# Patient Record
Sex: Female | Born: 1955
Health system: Southern US, Community
[De-identification: ages and names within clinical notes are randomized; demographics above are authoritative.]

---

## 2003-08-23 ENCOUNTER — Emergency Department (HOSPITAL_COMMUNITY): Admission: EM | Admit: 2003-08-23 | Discharge: 2003-08-24 | Payer: Self-pay | Admitting: Emergency Medicine

## 2003-08-24 ENCOUNTER — Ambulatory Visit (HOSPITAL_COMMUNITY): Admission: AD | Admit: 2003-08-24 | Discharge: 2003-08-24 | Payer: Self-pay | Admitting: Urology

## 2005-05-03 ENCOUNTER — Inpatient Hospital Stay (HOSPITAL_COMMUNITY): Admission: EM | Admit: 2005-05-03 | Discharge: 2005-05-04 | Payer: Self-pay | Admitting: Emergency Medicine

## 2006-02-13 IMAGING — CT CT HEAD W/O CM
1 series · 16 of 28 positions shown, 20 images · IV contrast (agent unspecified)
Comparison: None.

CLINICAL DATA: Sudden blurred vision twice today.  Rule out stroke. 
 HEAD CT WITHOUT CONTRAST:
TECHNIQUE: 5 mm collimated images were obtained from the base of the skull through the vertex according to standard protocol without contrast.

[Series 2: brain · axial · 0.47mm/px · z∈[+141,+268]mm · 16 of 28 slices shown, 20 images]
[im 2/28  brain]
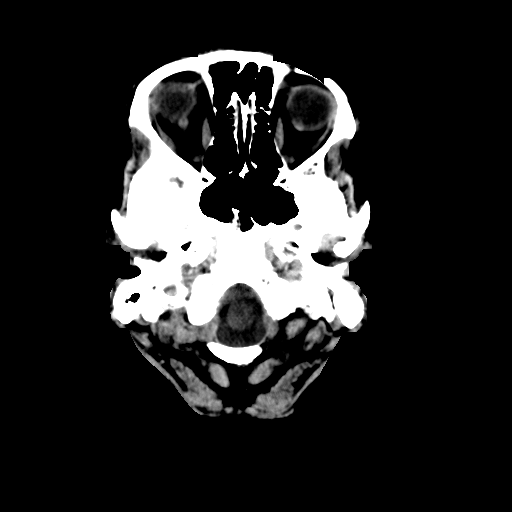
[im 2/28  bone]
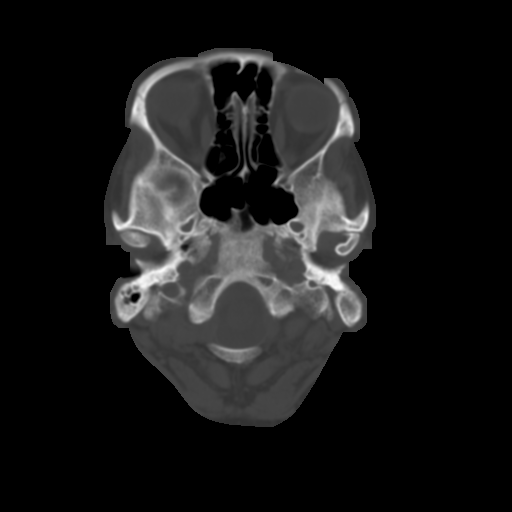
[im 4/28  brain]
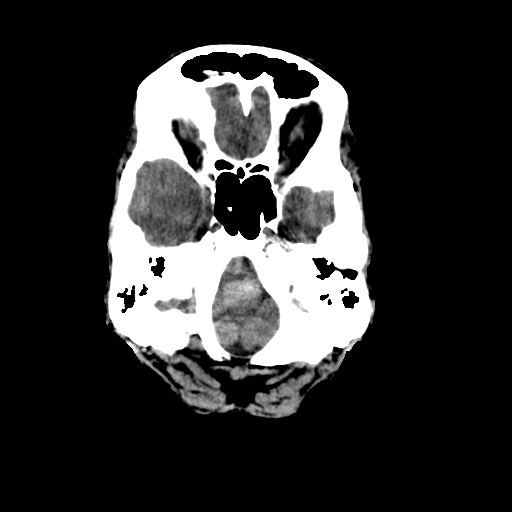
[im 6/28  brain]
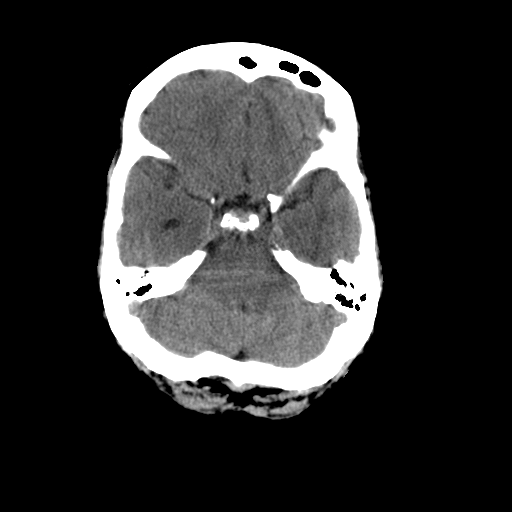
[im 7/28  brain]
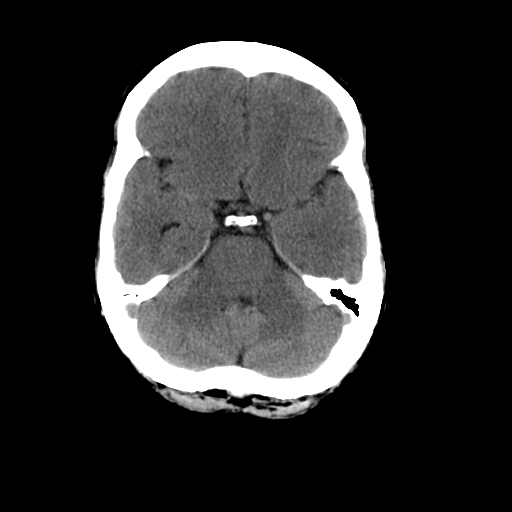
[im 9/28  brain]
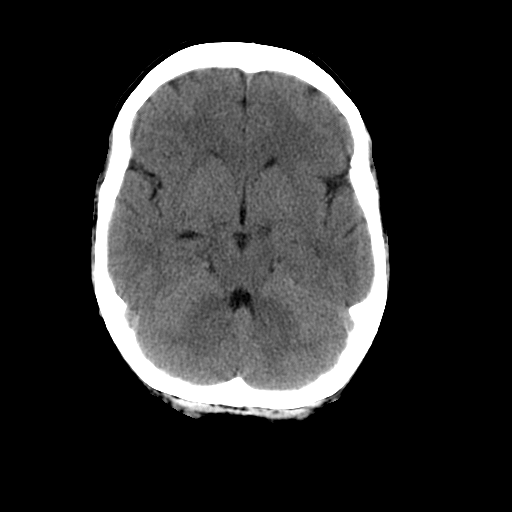
[im 9/28  bone]
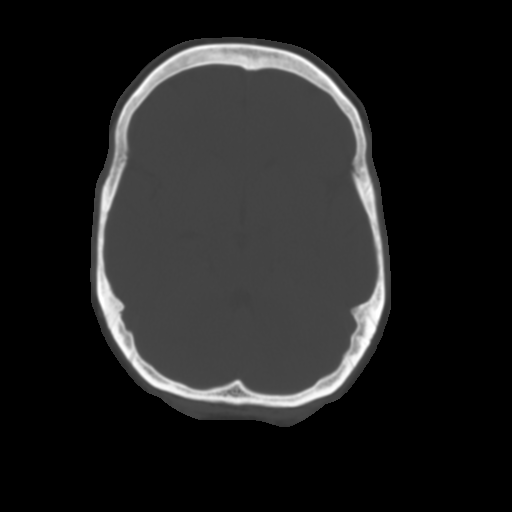
[im 10/28  brain]
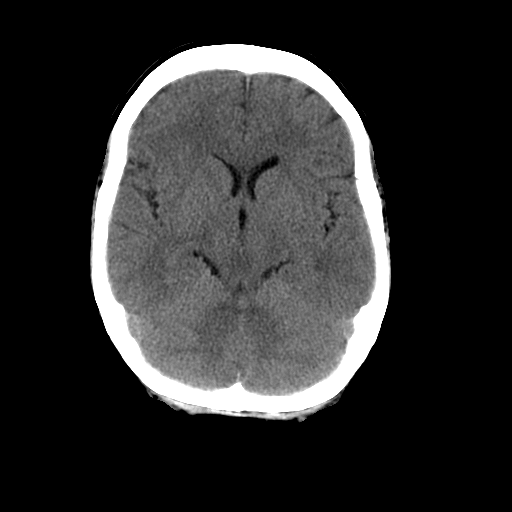
[im 12/28  brain]
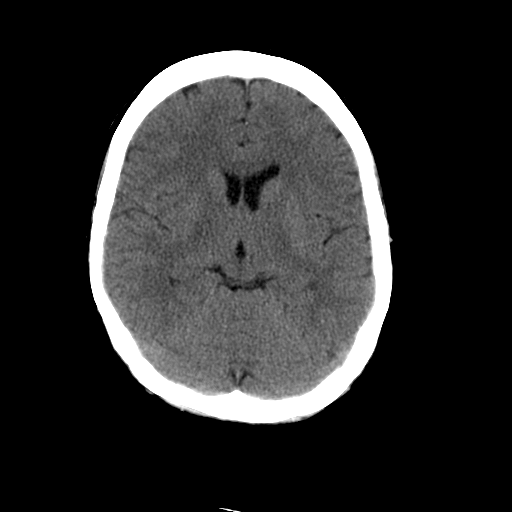
[im 14/28  brain]
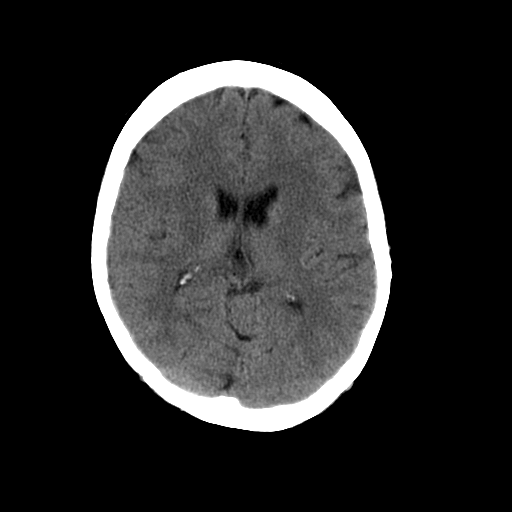
[im 15/28  brain]
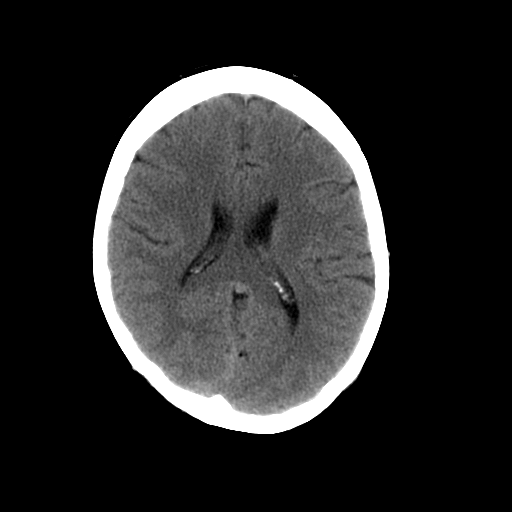
[im 15/28  bone]
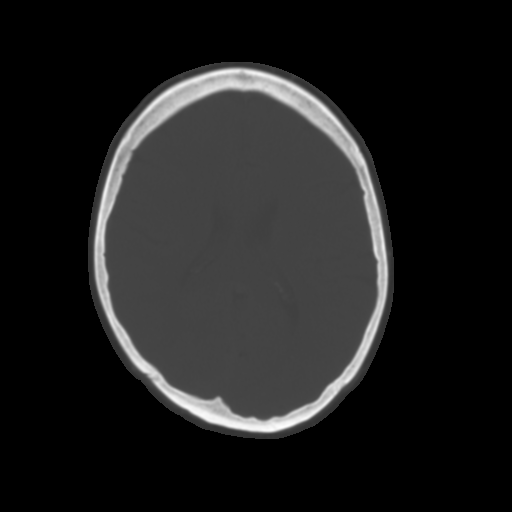
[im 17/28  brain]
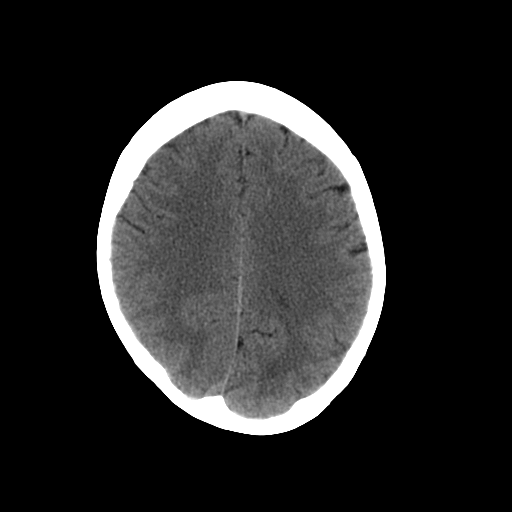
[im 19/28  brain]
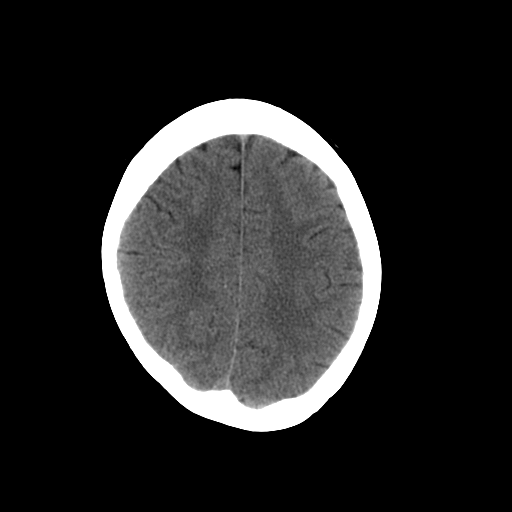
[im 20/28  brain]
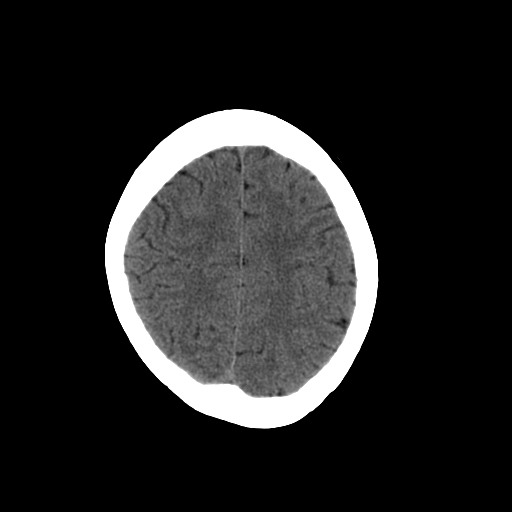
[im 22/28  brain]
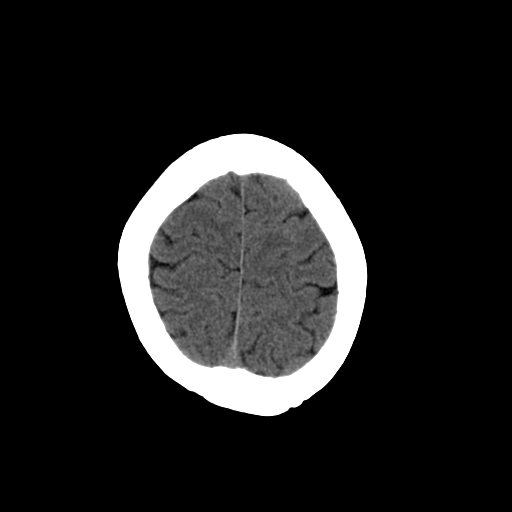
[im 22/28  bone]
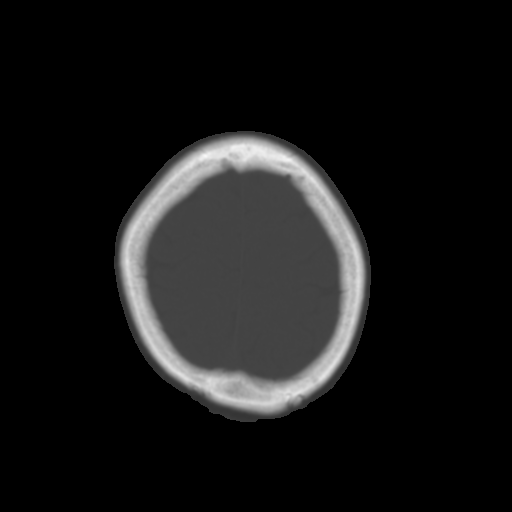
[im 23/28  brain]
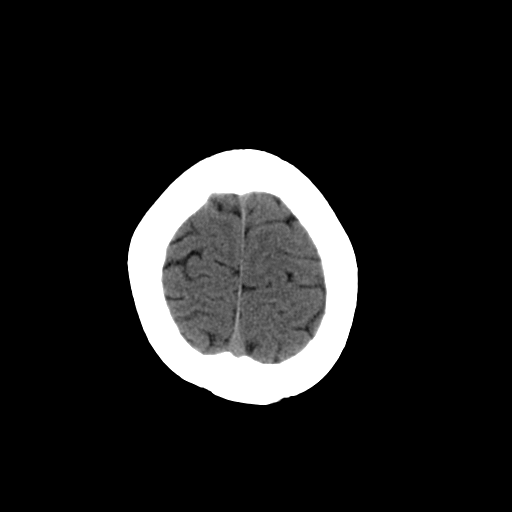
[im 25/28  brain]
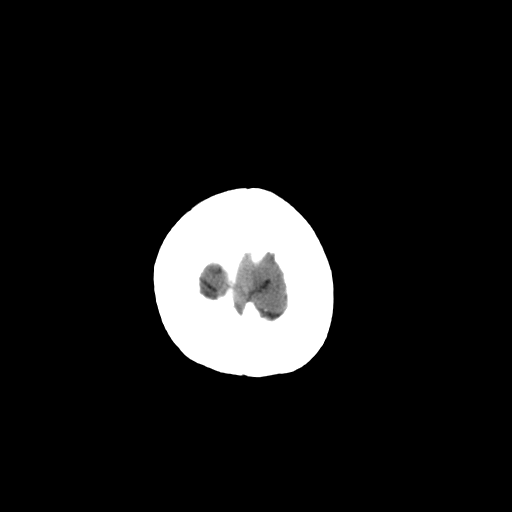
[im 27/28  brain]
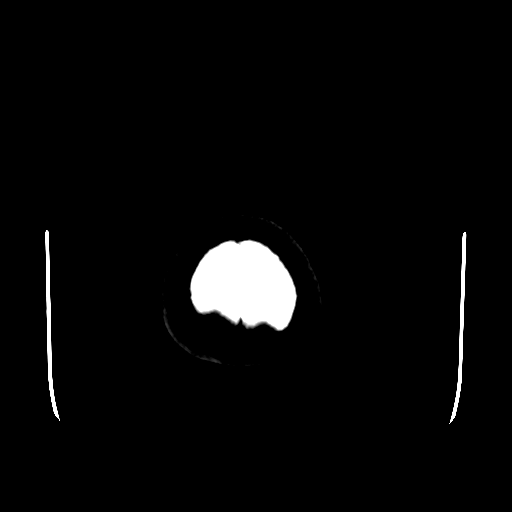

[16 of 28 positions shown; findings below may reference images not displayed]

FINDINGS: The brain parenchyma is normal in attenuation and morphology. 
 The ventricular volumes are normal.  The midline is maintained.  No abnormal extraaxial fluid collections or intracranial hemorrhage. 
 Mastoid air cells and paranasal sinuses are all normal pneumatized.
IMPRESSION: Normal brain.

## 2006-12-02 ENCOUNTER — Other Ambulatory Visit: Admission: RE | Admit: 2006-12-02 | Discharge: 2006-12-02 | Payer: Self-pay | Admitting: Family Medicine

## 2009-08-15 ENCOUNTER — Encounter: Admission: RE | Admit: 2009-08-15 | Discharge: 2009-09-15 | Payer: Self-pay | Admitting: Family Medicine

## 2009-11-16 ENCOUNTER — Other Ambulatory Visit: Admission: RE | Admit: 2009-11-16 | Discharge: 2009-11-16 | Payer: Self-pay | Admitting: Family Medicine

## 2011-03-15 NOTE — Op Note (Signed)
NAME:  Kathryn Hensley, Kathryn Hensley                           ACCOUNT NO.:  [id number]   MEDICAL RECORD NO.:  [id number]                   PATIENT TYPE:  AMB   LOCATION:  DAY                                  [facility]AC[facility]  [facility]   PHYSICIAN:  Ronald L. Earlene Plater, M.D.               DATE OF BIRTH:  06-13-1956   DATE OF PROCEDURE:  08/24/2003  DATE OF DISCHARGE:                                 OPERATIVE REPORT   PREOPERATIVE DIAGNOSIS:  Right ureteral calculus.   POSTOPERATIVE DIAGNOSIS:  Right ureteral calculus.   PROCEDURES:  1. Cystoscopy.  2. Right ureteroscopy with laser lithotripsy.  3. Right ureteral stent placement.  4. Right retrograde pyelogram.   SURGEON:  Ronald L. Earlene Plater, M.D.   ASSISTANT:  Susanne Borders, M.D.   ANESTHESIA:  General endotracheal.   DRAINS:  A 6 French x 26 cm double-J ureteral stent.   COMPLICATIONS:  None.   SPECIMENS:  Stone for analysis.   DISPOSITION:  Postanesthesia care unit in stable condition.   INDICATION FOR PROCEDURE:  Ms. Grange is a 55 year old white female with a  history of several days of right flank pain.  She was found to have a right  mid ureteral calculus approximately 6 mm in diameter.  She has been given  several days of pain and antinausea medication, but has been unable to pass  the stone.  Because of her persistent pain and inability to pass the stone,  she wished to have definitive treatment for the stone.  She agreed to  cystoscopy with right ureteroscopic lithotripsy after understanding the  risks, benefits, and alternatives.   DESCRIPTION OF PROCEDURE:  The patient was brought to the operating room and  correctly identified by her identification bracelet.  She as given  preoperative antibiotics and general endotracheal anesthesia.  She was  placed in the dorsal lithotomy position and prepped and draped in typical  sterile fashion.  A 22 French cystoscope with 12 degree lenses were used to  enter the patient's urethra and bladder.  The  urethra was within normal  limits.  The bladder demonstrated some injected blood vessels, but no other  mucosal abnormalities.  Bilateral ureteral orifices were noted in their  normal anatomic location.  A 6 French end-hole ureteral catheter was used to  cannulate the right ureteral orifice.  Approximately 6 mL of cystoscopic  Conray was injected and there was a distal filling defect approximately 3-4  cm above the right ureteral orifice consistent with the known calculus.  A  0.38 mm guide wire was placed through the ureteral catheter and the catheter  was removed.  The patient's bladder was then drained and a 6 French rigid  ureteroscope was used to enter the patient's urethra and bladder.  The  ureteral orifice was a little tight and we decided to dilate the patient's  right ureteral orifice with a 4 cm balloon dilator.  After  dilation, the  ureteroscope could easily be passed through the ureteral orifice up to the  level of the stone.  A 365 Micron laser fiber was then used at a setting of  5 joules and 5 hertz to laser the stone up into several small pieces.  After  the stone fragmentation was complete, the remaining stone fragments were  pulled from the ureter with a Nitinol basket.  The final survey with the  ureteroscope demonstrated no further stone material.  A 26 cm x 6 French  double-J ureteral stent was passed over the guide wire with fluoroscopic  guidance.  The stent was tethered.  An excellent curl was seen in the right  renal pelvis.  The cystoscope was replaced and a good distal curl was seen  under direct vision of the bladder.  The patient's bladder was then drained.  The string was taped to the patient's right thigh using Benzoin and pink  tape.  The patient was then awaken without complications and taken to the  postanesthesia care unit in stable condition, having tolerated the procedure  extremely well.  Please note that Windy Fast L. Earlene Plater, M.D., was present and   participated in all aspects of this case as he was the primary surgeon.     Susanne Borders, MD                           Lucrezia Starch. Earlene Plater, M.D.    DR/MEDQ  D:  08/24/2003  T:  08/24/2003  Job:  [id number]

## 2011-03-15 NOTE — Discharge Summary (Signed)
NAMEKHAMANI, DANIELY                 ACCOUNT NO.:  [id number]   MEDICAL RECORD NO.:  [id number]          PATIENT TYPE:  INP   LOCATION:  3017                         FACILITY:  [facility]   PHYSICIAN:  Casimiro Needle L. Reynolds, M.D.DATE OF BIRTH:  Aug 10, 1956   DATE OF ADMISSION:  05/03/2005  DATE OF DISCHARGE:  05/04/2005                                 DISCHARGE SUMMARY   ADMISSION DIAGNOSIS:  Amaurosis fugax, left eye.   DISCHARGE DIAGNOSIS:  Transient visual loss, left eye, presumed amaurosis  fugax, without evidence of significant carotid occlusive disease.   CONDITION ON DISCHARGE:  Stable.   DIET:  Low fat, low cholesterol.   ACTIVITY:  Ad lib.   MEDICATIONS ON DISCHARGE:  1.  Aspirin 81 mg p.o. daily (new).  2.  Multivitamin one daily (new).   STUDIES:  1.  MRA of the neck vessels with contrast performed May 04, 2005,      demonstrating significant bilateral external carotid artery disease but      no significant disease of the common or internal carotid arteries on      either side.  2.  Laboratory review.  CBC:  White count 8.3, hemoglobin 13.2, platelets      243,000.  Chemistries unremarkable.  Glucose 96.  Liver functions      unremarkable.  Coags normal.  Lipid panel performed fasting May 04, 2005:  Total cholesterol 195, HDL 43, LDL 135.   HOSPITAL COURSE:  Please see Admission H&P for full admission details.  Briefly, this is a 55 year old with little past medical history who  presented with near total painless visual loss o.s., concern of amaurosis  fugax was raised by a neuro-ophthalmologist and she presented to the [facility] Emergency Room for further evaluation of that.  She was subsequently  admitted primarily for evaluation of the carotid arteries.  This was  achieved on May 04, 2005, with MRA of the extracranial circulation, with the  results as noted above.  She had no events during the hospital stay  I also  looked into her vascular risk factors including  lipid panel, which is as  above, and homocysteine level which was pending at the time of discharge.  She was advised to take an aspirin a day, a multivitamin a day and was also  encouraged to stop smoking.  She was felt stable for discharge on the  afternoon of May 04, 2005, after reviewing her MRI.   FOLLOWUP:  She was advised to followup with her primary physician at  [facility] (Dr. Georgina Pillion) in 2 weeks, and will follow with  [facility] as needed.       MLR/MEDQ  D:  05/04/2005  T:  05/04/2005  Job:  [id number]   cc:   Lajean Manes, Dr.  [facility]  [address]  [place], [place] [address]

## 2011-03-15 NOTE — H&P (Signed)
Kathryn Hensley, Kathryn Hensley                 ACCOUNT NO.:  [id number]   MEDICAL RECORD NO.:  [id number]          PATIENT TYPE:  EMS   LOCATION:  MAJO                         FACILITY:  [facility]   PHYSICIAN:  Michael L. Reynolds, M.D.DATE OF BIRTH:  1956/01/25   DATE OF ADMISSION:  05/03/2005  DATE OF DISCHARGE:                                HISTORY & PHYSICAL   CHIEF COMPLAINT:  Transient left eye visual loss.   HISTORY OF PRESENT ILLNESS:  This is the initial [facility]  admission for this 55 year old woman with little past medical history.  The  patient reports that suddenly this morning she notes the onset of painless  near total visual loss on the left.  She was only able to see centrally with  peripheral bright light but no shapes.  This lasted about 15-20 minutes  and then completely resolved.  She denies any history of previous similar  events.  There was no associated headache, dizziness, nausea, vomiting,  slurred speech, or any numbness, tingling, or weakness of the extremities.  She does report having occasional brief splotches which last less than one  in minute which occur intermittently, and she actually had one of these this  evening while in the emergency room, but this other event was something  definitely different.  She does have a history of varying degree of  migraines, but she has not had any of these for years.  She saw an eye  doctor who suspected an ocular migraine but sent her to a neuro-  ophthalmologist who apparently raised concern about amaurosis fugax, and she  comes to the emergency room for further evaluation of that.   PAST MEDICAL HISTORY:  1.  She has a history of cervical cancer, with a hysterectomy in 1981.  2.  She denies any history of hypertension, hyperlipidemia, elevated blood      sugars, or heart disease.   FAMILY HISTORY:  Remarkable for a brother with a history of stroke and  coronary disease at a young age.  Her father also had  strokes at an older  age.   SOCIAL HISTORY:  She smokes a pack of cigarettes a day.  She is normally  independent in her activities of daily living.   ALLERGIES:  No known drug allergies.   MEDICATIONS:  None.   REVIEW OF SYSTEMS:  Negative across 10 systems, except for some recent nasal  congestion.  Otherwise per HPI and admission nursing records.   PHYSICAL EXAMINATION:  VITAL SIGNS:  Temperature 98.7; blood pressure  116/69; pulse 74; respirations 18.  GENERAL:  This is a healthy appearing female seen in no distress.  HEAD:  Cranium is normocephalic and atraumatic.  Oropharynx benign.  NECK:  Supple, without carotid bruits.  HEART:  Regular rate and rhythm, without murmurs.  CHEST:  Clear to auscultation bilaterally.  NEUROLOGIC:  Mental status:  She is awake and alert.  Speech is slow, but  not dysarthric.  Mood is euthymic, and affect appropriate.  She is fully  oriented to place and time.  Cranial nerves:  Funduscopic exam  is benign.  Specifically, blood vessels at the back of the left eye look fine.  Pupils  are equal and briskly reactive.  Extraocular movements full, without  nystagmus.  Visual fields full to confrontation.  Facial sensation is intact  to pin prick.  Face, tongue, and palate move normally and symmetrically.  Motor:  Normal bulk and tone.  Normal strength in all tested extremity  muscles.  Sensation intact to pin prick and light touch in all extremities.  Coordination:  Finger-to-nose and heel-to-shin are performed well.  Gait:  She arises from chair easily.  Her stance is normal.  She is able to heel,  toe, and tandem walk without difficulty.  Reflexes 2+ and symmetric.  Toes  are downgoing.   LABORATORY REVIEW:  CBC and CMET are unremarkable.  CT of the head is  personally reviewed and is unremarkable.   IMPRESSION:  Amaurosis fugax OS.   PLAN:  Will admit overnight to have an MRI of the neck done in the morning.  In the meantime, will treat with  aspirin daily and check stroke labs  including fasting lipid panel and homocysteine level.  If her MRI is okay,  she can probably be discharged in the morning.  She was encouraged to stop  smoking.       MLR/MEDQ  D:  05/03/2005  T:  05/04/2005  Job:  [id number]

## 2020-07-05 ENCOUNTER — Other Ambulatory Visit: Payer: Self-pay

## 2020-07-05 DIAGNOSIS — Z20822 Contact with and (suspected) exposure to covid-19: Secondary | ICD-10-CM

## 2020-07-07 LAB — NOVEL CORONAVIRUS, NAA: SARS-CoV-2, NAA: DETECTED — AB

## 2020-07-07 LAB — SARS-COV-2, NAA 2 DAY TAT

## 2020-07-08 ENCOUNTER — Telehealth (HOSPITAL_COMMUNITY): Payer: Self-pay | Admitting: Family

## 2020-07-08 DIAGNOSIS — U071 COVID-19: Secondary | ICD-10-CM

## 2020-07-08 NOTE — Telephone Encounter (Signed)
Called to discuss with Allen Norris about Covid symptoms and the use of casirivimab/imdevimab, a combination monoclonal antibody infusion for those with mild to moderate Covid symptoms and at a high risk of hospitalization.     Pt is qualified for this infusion at the infusion center due to co-morbid conditions and/or a member of an at-risk group, however declines infusion at this time. Symptoms tier reviewed as well as criteria for ending isolation.  Symptoms reviewed that would warrant ED/Hospital evaluation. Preventative practices reviewed. Patient verbalized understanding. Patient advised to call back if he decides that he does want to get infusion. Callback number to the infusion center given. Patient advised to go to Urgent care or ED with severe symptoms.   Last date she would be eligible for infusion is 07/13/20.   Symptoms of congestion, cough and fatigue with onset of 07/04/20.   BMI> 25.       Kiefer Opheim,NP

## 2020-07-13 ENCOUNTER — Telehealth: Payer: Self-pay | Admitting: Internal Medicine

## 2020-07-13 ENCOUNTER — Encounter: Payer: Self-pay | Admitting: Oncology

## 2020-07-13 ENCOUNTER — Telehealth: Payer: Self-pay | Admitting: Oncology

## 2020-07-13 NOTE — Telephone Encounter (Signed)
Called to Discuss with patient about Covid symptoms and the use of the monoclonal antibody infusion for those with mild to moderate Covid symptoms and at a high risk of hospitalization.     Pt is qualified for this infusion due to co-morbid conditions and/or a member of an at-risk group. BMI 31   There are no problems to display for this patient.   Patient declines infusion at this time. Symptoms tier reviewed as well as criteria for ending isolation. Preventative practices reviewed. Patient verbalized understanding.    Patient advised to call back if he/she decides that he/she does want to get infusion. Callback number to the infusion center given. Patient advised to go to Urgent care or ED with severe symptoms. Pt is on day 9 of symptoms. Told she would need to contact clinic ASAP if she changes her mind and cannot guarantee appt will be available.   Marcy Salvo, NP [facility]

## 2020-07-13 NOTE — Telephone Encounter (Signed)
Called to Discuss with patient about Covid symptoms and the use of regeneron, a monoclonal antibody infusion for those with mild to moderate Covid symptoms and at a high risk of hospitalization.     Pt is qualified for this infusion at the [facility] infusion center due to co-morbid conditions and/or a member of an at-risk group.    HIGH risk- BMI >25   Unable to reach pt. Left VM and mychart message.   Mignon Pine, AGNP-C [phone] (Infusion Center Hotline)

## 2020-11-07 ENCOUNTER — Encounter: Payer: Self-pay | Admitting: Orthopaedic Surgery

## 2020-11-07 ENCOUNTER — Ambulatory Visit: Payer: Self-pay

## 2020-11-07 ENCOUNTER — Other Ambulatory Visit: Payer: Self-pay

## 2020-11-07 ENCOUNTER — Ambulatory Visit: Payer: Self-pay | Admitting: Orthopaedic Surgery

## 2020-11-07 VITALS — Ht 69.0 in | Wt 200.0 lb

## 2020-11-07 DIAGNOSIS — M25562 Pain in left knee: Secondary | ICD-10-CM | POA: Diagnosis not present

## 2020-11-07 DIAGNOSIS — G8929 Other chronic pain: Secondary | ICD-10-CM

## 2020-11-07 DIAGNOSIS — M25552 Pain in left hip: Secondary | ICD-10-CM

## 2020-11-07 DIAGNOSIS — M79605 Pain in left leg: Secondary | ICD-10-CM

## 2020-11-07 MED ORDER — BUPIVACAINE HCL 0.25 % IJ SOLN
2.0000 mL | INTRAMUSCULAR | Status: AC | PRN
  Administered 2020-11-07: 2 mL via INTRA_ARTICULAR

## 2020-11-07 NOTE — Progress Notes (Signed)
Office Visit Note   Patient: Kathryn Hensley           Date of Birth: [birth year]           MRN: [id number] Visit Date: 11/07/2020              Requested by: No referring provider defined for this encounter. PCP: No primary care provider on file.   Assessment & Plan: Visit Diagnoses:  1. Pain in left hip   2. Chronic pain of left knee   3. Pain in left leg     Plan: Mrs. Suman has been experiencing pain in her left leg without a specific origin.  She is has experienced some pain in her left groin left thigh and left knee.  She has had a history of cortisone injection in her left knee in the past.  She does have some mild back pain but does not have any true radiculopathy.  She has not had any evidence of numbness or tingling or neurologic deficit.  X-rays of her pelvis were negative.  She does have some degenerative changes in her left knee.  I think there is a reasonable possibility that her problem is related to her knee.  I have injected the lateral aspect of her left knee with betamethasone and we will monitor her response over the next 3 to 4 weeks.  Consider further diagnostic testing and, specifically, lumbar spine if no improvement  Follow-Up Instructions: Return in about 1 month (around 12/08/2020).   Orders:  Orders Placed This Encounter  Procedures  . Large Joint Inj: L knee  . XR HIP UNILAT W OR W/O PELVIS 2-3 VIEWS LEFT  . XR KNEE 3 VIEW LEFT   No orders of the defined types were placed in this encounter.     Procedures: Large Joint Inj: L knee on 11/07/2020 4:29 PM Indications: pain and diagnostic evaluation Details: 25 G 1.5 in needle, lateral approach  Arthrogram: No  Medications: 2 mL bupivacaine 0.25 %  2 mL betamethasone injected with Marcaine in the lateral patellar region left knee Procedure, treatment alternatives, risks and benefits explained, specific risks discussed. Consent was given by the patient. Patient was prepped and draped in the usual sterile  fashion.       Clinical Data: No additional findings.   Subjective: Chief Complaint  Patient presents with  . Right Leg - Pain  Patient presents today for right hip, thigh, and knee pain. She said that it started about two months ago. No known injury. Her pain began in her lateral hip, groin, and then into her thigh. She said that now her right knee hurts and gets sharp shooting pains. No numbness or tingling in her lower extremities. She has been experiencing a burning sensation in her thigh. She states that her pain is worse with activity, but can also bother her while resting. She does state that she has lower back and buttock pain as well. No previous surgery in any of these areas. She has tried alternating Ibuprofen and Naproxen. She has also tried icing her knee.  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  Has not had any true radicular symptoms but mostly pain along the lateral left hip, left groin, thigh and knee.  Has had prior problems with her left knee with cortisone injection  HPI  Review of Systems   Objective: Vital Signs: Ht 5\' 9"  (1.753 m)   Wt 200 lb (90.7 kg)   BMI 29.53  kg/m   Physical Exam Constitutional:      Appearance: She is well-developed and well-nourished.  HENT:     Mouth/Throat:     Mouth: Oropharynx is clear and moist.  Eyes:     Extraocular Movements: EOM normal.     Pupils: Pupils are equal, round, and reactive to light.  Pulmonary:     Effort: Pulmonary effort is normal.  Skin:    General: Skin is warm and dry.  Neurological:     Mental Status: She is alert and oriented to person, place, and time.  Psychiatric:        Mood and Affect: Mood and affect normal.        Behavior: Behavior normal.     Ortho Exam painless range of motion of both hips with internal and external rotation.  No loss of motion left hip.  No pain along the lateral aspect of the hip i.e. trochanter or in the buttock.  Does have some pain along the  lateral patella consistent with a lateral patella tilt on her films.  No knee effusion.  Full extension and flexed over 100 degrees without instability.  Straight leg raise negative.  No percussible tenderness of the lumbar spine or either SI joint.  Motor and sensory exam intact  Specialty Comments:  No specialty comments available.  Imaging: XR HIP UNILAT W OR W/O PELVIS 2-3 VIEWS LEFT  Result Date: 11/07/2020 AP pelvis and lateral left hip were obtained without acute changes.  Joint spaces appear to be symmetrical bilaterally without narrowing.  Little bit of ectopic calcification of the lateral aspect of the acetabular lip little more on the left than the right.  Otherwise no ectopic calcification.  Mild degenerative changes of both SI joints.  XR KNEE 3 VIEW LEFT  Result Date: 11/07/2020 3 views of the left knee were obtained in the standing projection.  There is lateral patella tilt bilaterally.  No ectopic calcification.  Mild joint space narrowing more lateral than medial.  Small osteophytes laterally.  No acute changes.  Films are consistent with mild to moderate osteoarthritis    PMFS History: Patient Active Problem List   Diagnosis Date Noted  . Pain in left leg 11/07/2020   History reviewed. No pertinent past medical history.  History reviewed. No pertinent family history.  History reviewed. No pertinent surgical history. Social History   Occupational History  . Not on file  Tobacco Use  . Smoking status: Not on file  . Smokeless tobacco: Not on file  Substance and Sexual Activity  . Alcohol use: Not on file  . Drug use: Not on file  . Sexual activity: Not on file

## 2020-11-15 ENCOUNTER — Encounter: Payer: Self-pay | Admitting: Orthopaedic Surgery

## 2020-12-05 ENCOUNTER — Other Ambulatory Visit: Payer: Self-pay

## 2020-12-05 ENCOUNTER — Encounter: Payer: Self-pay | Admitting: Orthopaedic Surgery

## 2020-12-05 ENCOUNTER — Ambulatory Visit (INDEPENDENT_AMBULATORY_CARE_PROVIDER_SITE_OTHER): Payer: BC Managed Care – PPO

## 2020-12-05 ENCOUNTER — Ambulatory Visit: Payer: BC Managed Care – PPO | Admitting: Orthopaedic Surgery

## 2020-12-05 DIAGNOSIS — M545 Low back pain, unspecified: Secondary | ICD-10-CM | POA: Insufficient documentation

## 2020-12-05 DIAGNOSIS — M79605 Pain in left leg: Secondary | ICD-10-CM | POA: Diagnosis not present

## 2020-12-05 DIAGNOSIS — G8929 Other chronic pain: Secondary | ICD-10-CM

## 2020-12-05 DIAGNOSIS — M5442 Lumbago with sciatica, left side: Secondary | ICD-10-CM | POA: Diagnosis not present

## 2020-12-05 NOTE — Progress Notes (Signed)
Office Visit Note   Patient: Kathryn Hensley           Date of Birth: 1956-09-11           MRN: [id number] Visit Date: 12/05/2020              Requested by: No referring provider defined for this encounter. PCP: No primary care provider on file.   Assessment & Plan: Visit Diagnoses:  1. Pain in left leg   2. Chronic left-sided low back pain with left-sided sciatica     Plan: Mrs. Ringley relates that her left knee pain is "90% better".  She is also having some back left buttock and left thigh discomfort which seems to be completely separate.  She has had a history of some back problems when she was driving long distances in the past and I think she has a separate problem from her knee  related to her lumbar spine.  No numbness or tingling but "just pain".  The pain does not seem to radiate distal to the thigh.  No bowel or bladder changes.  Will order an MRI scan.  Follow-Up Instructions: Return in about 1 month (around 01/02/2021), or After MRI scan lumbar spine.   Orders:  Orders Placed This Encounter  Procedures  . MR Lumbar Spine w/o contrast   No orders of the defined types were placed in this encounter.     Procedures: No procedures performed   Clinical Data: No additional findings.   Subjective: Chief Complaint  Patient presents with  . Left Leg - Follow-up  Patient presents today for a one month follow up on her left leg. She received a cortisone injection in her left knee at her last visit. She said that the injection helped her knee feel about 80% better. She still has pain in her thigh, groin, and lower back. She takes Aleve or Ibuprofen as needed.  Has a history of recurrent back pain with discomfort into her left buttock and left groin.  Her hip exam was benign last week and again today so I think the problem might be related to her back  HPI  Review of Systems   Objective: Vital Signs: There were no vitals taken for this visit.  Physical  Exam Constitutional:      Appearance: She is well-developed and well-nourished.  HENT:     Mouth/Throat:     Mouth: Oropharynx is clear and moist.  Eyes:     Extraocular Movements: EOM normal.     Pupils: Pupils are equal, round, and reactive to light.  Pulmonary:     Effort: Pulmonary effort is normal.  Skin:    General: Skin is warm and dry.  Neurological:     Mental Status: She is alert and oriented to person, place, and time.  Psychiatric:        Mood and Affect: Mood and affect normal.        Behavior: Behavior normal.     Ortho Exam awake alert and oriented x3.  Comfortable sitting.  Left knee did not have the lateral joint pain that she had in the past. no effusion.  Painless range of motion and no instability.  Painless range of motion both right and left hip.  Straight leg raise negative.  Had some mild percussible tenderness at the lumbosacral junction.  No buttock discomfort.  Specialty Comments:  No specialty comments available.  Imaging: No results found.   PMFS History: Patient Active Problem List  Diagnosis Date Noted  . Low back pain 12/05/2020  . Pain in left leg 11/07/2020   History reviewed. No pertinent past medical history.  History reviewed. No pertinent family history.  History reviewed. No pertinent surgical history. Social History   Occupational History  . Not on file  Tobacco Use  . Smoking status: Not on file  . Smokeless tobacco: Not on file  Substance and Sexual Activity  . Alcohol use: Not on file  . Drug use: Not on file  . Sexual activity: Not on file

## 2020-12-22 ENCOUNTER — Ambulatory Visit
Admission: RE | Admit: 2020-12-22 | Discharge: 2020-12-22 | Disposition: A | Discharge status: Home / Self Care | Payer: BC Managed Care – PPO | Source: Ambulatory Visit | Attending: Orthopaedic Surgery | Admitting: Orthopaedic Surgery

## 2020-12-22 DIAGNOSIS — G8929 Other chronic pain: Secondary | ICD-10-CM

## 2020-12-22 DIAGNOSIS — M79605 Pain in left leg: Secondary | ICD-10-CM

## 2020-12-22 DIAGNOSIS — M5442 Lumbago with sciatica, left side: Secondary | ICD-10-CM

## 2020-12-27 ENCOUNTER — Ambulatory Visit: Payer: BC Managed Care – PPO | Admitting: Orthopaedic Surgery

## 2020-12-27 ENCOUNTER — Other Ambulatory Visit: Payer: Self-pay

## 2020-12-27 ENCOUNTER — Encounter: Payer: Self-pay | Admitting: Orthopaedic Surgery

## 2020-12-27 VITALS — Ht 69.0 in | Wt 200.0 lb

## 2020-12-27 DIAGNOSIS — G8929 Other chronic pain: Secondary | ICD-10-CM | POA: Diagnosis not present

## 2020-12-27 DIAGNOSIS — M5442 Lumbago with sciatica, left side: Secondary | ICD-10-CM

## 2020-12-27 NOTE — Progress Notes (Signed)
Office Visit Note   Patient: Kathryn Hensley           Date of Birth: May 19, 1956           MRN: [id number] Visit Date: 12/27/2020              Requested by: No referring provider defined for this encounter. PCP: Pcp, No   Assessment & Plan: Visit Diagnoses:  1. Chronic left-sided low back pain with left-sided sciatica     Plan: Kathryn Hensley had an MRI scan of her lumbar spine demonstrating mild multifactorial spinal stenosis at L4-5 with asymmetric narrowing of the left lateral recess and possible left L5 nerve root encroachment.  There was also mild asymmetric narrowing of the left foramen at L5-S1 with possible left L5 nerve root encroachment.  She is experiencing back and left lower extremity pain particularly when she stands for a length of time or walks any distance.  We have had a long discussion regarding the above I think therapy would be helpful as well as an epidural steroid injection.  We will set the therapy up for her here in the office as it is convenient and asked Dr. Alvester Morin to consider an epidural steroid injection.  She notes that her knee problem previously addressed is much better  Follow-Up Instructions: Return in about 1 month (around 01/27/2021).   Orders:  Orders Placed This Encounter  Procedures  . Ambulatory referral to Physical Medicine Rehab  . Ambulatory referral to Physical Therapy   No orders of the defined types were placed in this encounter.     Procedures: No procedures performed   Clinical Data: No additional findings.   Subjective: Chief Complaint  Patient presents with  . Lower Back - Follow-up    MRI review  Patient presents today for follow up on her lower back. She had an MRI done and is here today to discuss those results.  No change in symptoms.  Denies any bowel or bladder changes.  On occasion she feels like her left leg may be a little bit weak.  The pain still originates in the lumbar spine radiating to her left buttock her left  thigh and sometimes to her left calf and foot.  No symptoms on the right  HPI  Review of Systems   Objective: Vital Signs: Ht 5\' 9"  (1.753 m)   Wt 200 lb (90.7 kg)   BMI 29.53 kg/m   Physical Exam Constitutional:      Appearance: She is well-developed and well-nourished.  HENT:     Mouth/Throat:     Mouth: Oropharynx is clear and moist.  Eyes:     Extraocular Movements: EOM normal.     Pupils: Pupils are equal, round, and reactive to light.  Pulmonary:     Effort: Pulmonary effort is normal.  Skin:    General: Skin is warm and dry.  Neurological:     Mental Status: She is alert and oriented to person, place, and time.  Psychiatric:        Mood and Affect: Mood and affect normal.        Behavior: Behavior normal.     Ortho Exam awake alert and oriented x3.  Comfortable sitting and in no acute distress.  Straight leg raise negative bilaterally.  Motor and sensory exam intact.  Reflexes were symmetrical.  No percussible tenderness of the lumbar spine or the paraspinal area.  No sacral discomfort or discomfort over the SI joints.  Painless range of  motion both hips  Specialty Comments:  No specialty comments available.  Imaging: No results found.   PMFS History: Patient Active Problem List   Diagnosis Date Noted  . Low back pain 12/05/2020  . Pain in left leg 11/07/2020   History reviewed. No pertinent past medical history.  History reviewed. No pertinent family history.  History reviewed. No pertinent surgical history. Social History   Occupational History  . Not on file  Tobacco Use  . Smoking status: Not on file  . Smokeless tobacco: Not on file  Substance and Sexual Activity  . Alcohol use: Not on file  . Drug use: Not on file  . Sexual activity: Not on file

## 2021-01-09 ENCOUNTER — Other Ambulatory Visit: Payer: Self-pay

## 2021-01-09 ENCOUNTER — Ambulatory Visit: Payer: BC Managed Care – PPO | Attending: Orthopaedic Surgery | Admitting: Physical Therapy

## 2021-01-09 ENCOUNTER — Encounter: Payer: Self-pay | Admitting: Physical Therapy

## 2021-01-09 DIAGNOSIS — G8929 Other chronic pain: Secondary | ICD-10-CM | POA: Insufficient documentation

## 2021-01-09 DIAGNOSIS — M5442 Lumbago with sciatica, left side: Secondary | ICD-10-CM | POA: Insufficient documentation

## 2021-01-09 DIAGNOSIS — M25552 Pain in left hip: Secondary | ICD-10-CM | POA: Diagnosis present

## 2021-01-09 DIAGNOSIS — R262 Difficulty in walking, not elsewhere classified: Secondary | ICD-10-CM | POA: Insufficient documentation

## 2021-01-09 NOTE — Therapy (Signed)
[facility] [address] [place], [address] Phone: [phone]   Fax:  [phone]  Physical Therapy Evaluation  Patient Details  Name: Kathryn Hensley MRN: [id number] Date of Birth: 08/18/1956 Referring Provider (PT): Valeria Batman, MD (orthopedics)   Encounter Date: 01/09/2021   PT End of Session - 01/09/21 1907    Visit Number 1    Number of Visits 24    Date for PT Re-Evaluation 04/03/21    Authorization Type [employer] reporting period from 01/09/2021    Authorization - Visit Number 1    Authorization - Number of Visits 30   PT/OT/Chiro   Progress Note Due on Visit 10    PT Start Time 1515    PT Stop Time 1600    PT Time Calculation (min) 45 min    Activity Tolerance Patient tolerated treatment well    Behavior During Therapy [facility] for tasks assessed/performed           History reviewed. No pertinent past medical history.  History reviewed. No pertinent surgical history.  There were no vitals filed for this visit.    Subjective Assessment - 01/09/21 1525    Subjective Patient reports when her pain started she thought it was her left knee. She even had a joint injection in the left knee (Jan/feb? This year) which helped the knee but not the rest of her pain. She was also having some pain in her left groin, hip, and left lower back. 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 She did not do much this weekend and she felt so much better yesterday but back and hip started hurting again today after going back to work yesterday.  She re(prts increasing pain/paresthesia by the end of the week of work. She thinks her pain started at the end of November. She had had COVID19 in September when she was due for second COVID shot. No specific injury or event. She cannot remember if it came on gradually or suddenly. Reports the symptom come and go. If she  stays off of it. It gets better, if she is doing more it causes problems. Weekly cycles of better/worse based on her work schedule. History of sciatica in left side, but has not had an issue with it lately and this feels different. No sciatica or pain on right side. Planning to get back injection next Monday. Has tried naproxin, ibuprofen, heat/ice with minimal improvement.    Pertinent History Patient is a 65 y.o. female who presents to outpatient physical therapy with a referral for medical diagnosis chronic left-sided low back pain with left-sided sciatica. This patient's chief complaints consist of left low back pain with radiation towards left LE, left hip and knee pain leading to the following functional deficits: difficulty with all of her usual activities, making them slower, such as dressing, working, stairs, weight bearing, standing, walking, etc, and decreases her quality of life.   Relevant past medical history and comorbidities include BPPV, hx of hysterectomy and left sided sciatica.  Patient denies hx of cancer, stroke, seizures, lung problem, major cardiac events, diabetes, unexplained weight loss, changes in bowel or bladder problems, new onset stumbling or dropping things, spine surgery.    Limitations Lifting;Standing;Walking;House hold activities   Functional Limitations: difficulty with all of her usual activities, making them slower, such as dressing, lifting, bending, working, stairs, weight bearing, standing,  walking, etc, and decreases her quality of life.   How long can you stand comfortably? 1 or 1/2 lap around mall    Diagnostic tests Lumbar MRI report 12/22/2020: "  IMPRESSION:  1. Mild multifactorial spinal stenosis at L4-5 with asymmetric  narrowing of the left lateral recess and possible left L5 nerve root  encroachment.  2. Mild asymmetric narrowing of the left foramen at L5-S1 with  possible left L5 nerve root encroachment."    Patient Stated Goals "get rid of this pain"     Currently in Pain? Yes    Pain Score 8    W: 21/10; B: 3-4/10   Pain Location Back    Pain Orientation Left;Lower    Pain Descriptors / Indicators Burning   "feels like someone is pushing in back" thigh "almost like a menstrual cram"   Pain Type Chronic pain    Pain Radiating Towards R groin and anterior thigh, lateral knee. Denies pain to feet. Does have tingling down to the foot when symptoms are worse.    Pain Onset More than a month ago    Pain Frequency Constant    Aggravating Factors  stairs, walking, bending, lifting, stretching forwards    Pain Relieving Factors sitting, recliner, not moving    Effect of Pain on Daily Activities Functional Limitations: difficulty with all of her usual activities, making them slower, such as dressing, lifting, bending, working, stairs, weight bearing, standing, walking, etc, and decreases her quality of life.              [facility] PT Assessment - 01/09/21 0001      Assessment   Medical Diagnosis chronic left-sided low back pain with left-sided sciatica    Referring Provider (PT) Valeria Batman, MD (orthopedics)    Onset Date/Surgical Date --   November 2021   Prior Therapy none for this problem prior to current episode of care      Precautions   Precautions None      Restrictions   Weight Bearing Restrictions No      Balance Screen   Has the patient fallen in the past 6 months No    Has the patient had a decrease in activity level because of a fear of falling?  No    Is the patient reluctant to leave their home because of a fear of falling?  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 Posture: mild forward flexion in standing, mildly flattened lumbar curve.  . Tremor: none . Muscle bulk: no gross abnormalities . Bed mobility: supine<> sit and rolling WFL except slower due to pain . Transfers: sit <> stand mod I due to caution and decreased speed.  . Gait: grossly WFL for household and short community ambulation. More detailed gait analysis deferred to later date as needed.   NEUROLOGICAL Upper Motor Neuron Screen Hoffman's, and Clonus (ankle) negative bilaterally Dermatomes . L2-S2  appears equal and intact to light touch. Myotomes . L2-S1 appears intact, left sided weakness for knee flexion that appeared neurological (S2).  Neurodynamic Tests    SLR: R = negative, L = positive for posterior thigh pain.   SPINE MOTION Lumbar AROM *Indicates pain - Flexion: = 2 inches proximal to ankles, tightness in back of legs, not concordant. No further pain with OP - Extension: = 50% no concordant pain, left low back concordant pain with OP - Rotation: R = WNL but concordant pain at L groin and low back, L = WNL. - Side Flexion (with OP): R = WNL, L = WNL   PERIPHERAL JOINT MOTION (in degrees) Passive Range of Motion (AROM) BLE appear grossly WFL except:  L hip IR limited and reproduces pain in L low back.   MUSCLE PERFORMANCE (MMT):  *Indicates pain 01/09/21 Date Date  Joint/Motion R/L R/L R/L  Hip     Flexion 4/4 / /  Extension (knee ext) 4/4* / /  Abduction 4+/4+ / /  Knee     Extension 5/5 / /  Flexion 5/4 / /  Comments: neurologic weakness L knee flexion. B ankles WFL, able to heel and toe walk.   SPECIAL TESTS: Straight leg raise (SLR): R = negative, L = positive for posterior thigh pain.  SIdelying Femoral Nerve Neurodynamic test: L = positive for anterior R thigh pain, slightly less with neck extension.  FABER: R =  positive for lateral hip pain (not concordant), L = positive for concordant groin pain  ACCESSORY MOTION:  - CPA to lowest lumbar segments reproduced back pain (reports no reproduction of concordant leg pain).   PALPATION: - TTP at L greater trochanter, sensitive and ticklish to palpation at R deep hip rotators.   EDUCATION/COGNITION: Patient is alert and oriented X 4.  Objective measurements completed on examination: See above findings.      PT Education - 01/09/21 1907    Education Details examination purpose/form. Self management techniques. Education on diagnosis, prognosis, POC, anatomy and physiology of current condition    Person(s) Educated Patient    Methods Explanation;Demonstration;Tactile cues;Verbal cues    Comprehension Verbalized understanding;Returned demonstration;Verbal cues required;Tactile cues required;Need further instruction            PT Short Term Goals - 01/09/21 1857      PT SHORT TERM GOAL #1   Title Be independent with initial home exercise program for self-management of symptoms.    Baseline Initial HEP to be provided at visit 2 as appropriate (01/09/2021);    Time 3    Period Weeks    Status New    Target Date 01/30/21             PT Long Term Goals - 01/09/21 1855      PT LONG TERM GOAL #1   Title Be independent with a long-term home exercise program for self-management of symptoms.    Baseline initial HEP to be provided at visit 2 as appropriate (01/09/2021);    Time 12    Period Weeks    Status New   TARGET DATE FOR ALL LONG TERM GOALS: 04/03/2021     PT LONG TERM GOAL #2   Title Demonstrate improved FOTO score to equal or greater than 66 by visit #10 to demonstrate improvement in overall condition and self-reported functional ability.    Baseline 52 (01/07/2021);    Time 12    Period Weeks    Status New  PT LONG TERM GOAL #3   Title Have full lumbar and left hip AROM with no compensations or increase in pain in all planes  except intermittent end range discomfort to allow patient to complete valued activities with less difficulty.    Baseline painful and limited - see objective (01/09/2021);    Time 12    Period Weeks    Status New      PT LONG TERM GOAL #4   Title Improve B LE  strength with no increase in pain to 5/5 for improved ability to allow patient to complete valued functional tasks such as work and stairs without difficulty.    Baseline painful and weak - see objective exam (01/09/2021);    Time 12    Period Weeks    Status New      PT LONG TERM GOAL #5   Title Complete community, work and/or recreational activities without limitation due to current condition.    Baseline difficulty with all of her usual activities, making them slower, such as dressing, lifting, bending, working, stairs, weight bearing, standing, walking (01/09/2021);    Time 12    Period Weeks    Status New      Additional Long Term Goals   Additional Long Term Goals Yes      PT LONG TERM GOAL #6   Title Reduce pain with functional activities to equal or less than 1/10 to allow patient to complete usual activities including ADLs, IADLs, and social engagement with less difficulty.    Baseline 12/10 (01/09/2021);    Time 12    Period Weeks    Status New                  Plan - 01/09/21 1920    Clinical Impression Statement Patient is a 65 y.o. female referred to outpatient physical therapy with a medical diagnosis of chronic left-sided low back pain with left-sided sciatica who presents with signs and symptoms consistent with chronic low back pain with radiation towards the left LE including paresthesia to the toes. Patient may also have a contributing hip problem based on positive FABER test, location of symptoms, and examination. However, unable to rule out low back as source of left hip pain and may be fully radiation form low back. Will continue to assess at future sessions. Patient presents with significant pain,  joint stiffness, ROM, muscle tension, paresthesia, muscle performance (strength/power/endurance), and activity tolerance impairments that are limiting ability to complete her usual activities, making them slower, such as dressing, lifting, bending, working, stairs, weight bearing, standing, walking, etc, without difficulty, and decreases her quality of life. Patient will benefit from skilled physical therapy intervention to address current body structure impairments and activity limitations to improve function and work towards goals set in current POC in order to return to prior level of function or maximal functional improvement.    Personal Factors and Comorbidities Age;Past/Current Experience;Comorbidity 1;Profession;Time since onset of injury/illness/exacerbation;Comorbidity 3+    Comorbidities BPPV, hx of L sided sciatica, hysterectomy    Examination-Activity Limitations Bend;Squat;Hygiene/Grooming;Bathing;Lift;Stairs;Locomotion Level;Stand;Caring for Others;Carry;Transfers;Dressing    Examination-Participation Restrictions Interpersonal Relationship;Occupation;Driving;Community Activity;Cleaning;Shop;Laundry;Yard Work   difficulty with all of her usual activities, making them slower, such as dressing, lifting, bending, working, stairs, weight bearing, standing, walking, etc, and decreases her quality of life.   Stability/Clinical Decision Making Evolving/Moderate complexity    Clinical Decision Making Moderate    Rehab Potential Good    PT Frequency 2x / week    PT Duration 12  weeks    PT Treatment/Interventions ADLs/Self Care Home Management;Aquatic Therapy;Cryotherapy;Moist Heat;Electrical Stimulation;Neuromuscular re-education;Therapeutic exercise;Therapeutic activities;Patient/family education;Manual techniques;Dry needling;Passive range of motion;Spinal Manipulations;Joint Manipulations    PT Next Visit Plan etablish initial HEP    PT Home Exercise Plan TBD    Consulted and Agree with Plan of  Care Patient           Patient will benefit from skilled therapeutic intervention in order to improve the following deficits and impairments:  Abnormal gait,Increased fascial restricitons,Dizziness,Pain,Decreased mobility,Increased muscle spasms,Decreased activity tolerance,Decreased endurance,Decreased range of motion,Decreased strength,Hypomobility,Impaired perceived functional ability,Impaired flexibility,Difficulty walking  Visit Diagnosis: Chronic left-sided low back pain with left-sided sciatica  Pain in left hip  Difficulty in walking, not elsewhere classified     Problem List Patient Active Problem List   Diagnosis Date Noted  . Low back pain 12/05/2020  . Pain in left leg 11/07/2020    Luretha MurphySara R. Ilsa IhaSnyder, PT, DPT 01/09/21, 7:28 PM  [facility] [address] [place], [place], [id number] Phone: [phone]   Fax:  [phone]  Name: Allen NorrisDebra C Evinger MRN: [id number] Date of Birth: Jul 11, 1956

## 2021-01-11 ENCOUNTER — Encounter: Payer: Self-pay | Admitting: Physical Therapy

## 2021-01-11 ENCOUNTER — Ambulatory Visit: Payer: BC Managed Care – PPO | Admitting: Physical Therapy

## 2021-01-11 ENCOUNTER — Other Ambulatory Visit: Payer: Self-pay

## 2021-01-11 DIAGNOSIS — M5442 Lumbago with sciatica, left side: Secondary | ICD-10-CM

## 2021-01-11 DIAGNOSIS — R262 Difficulty in walking, not elsewhere classified: Secondary | ICD-10-CM

## 2021-01-11 DIAGNOSIS — G8929 Other chronic pain: Secondary | ICD-10-CM

## 2021-01-11 DIAGNOSIS — M25552 Pain in left hip: Secondary | ICD-10-CM

## 2021-01-11 NOTE — Therapy (Signed)
[facility] PH[facility] MEDICINE [address], [place], [address] Phone: [phone]   Fax:  [phone]  Physical Therapy Treatment  Patient Details  Name: Kathryn Hensley MRN: [id number] Date of Birth: 02-05-1956 Referring Provider (PT): Valeria Batman, MD (orthopedics)   Encounter Date: 01/11/2021   PT End of Session - 01/11/21 1930    Visit Number 2    Number of Visits 24    Date for PT Re-Evaluation 04/03/21    Authorization Type [employer] reporting period from 01/09/2021    Authorization - Visit Number 2    Authorization - Number of Visits 30   PT/OT/Chiro   Progress Note Due on Visit 10    PT Start Time 1647    PT Stop Time 1727    PT Time Calculation (min) 40 min    Activity Tolerance Patient limited by pain    Behavior During Therapy [facility] for tasks assessed/performed           History reviewed. No pertinent past medical history.  History reviewed. No pertinent surgical history.  There were no vitals filed for this visit.   Subjective Assessment - 01/11/21 1648    Subjective Patient reports she was in a lot of pain when she got up yesterday morning in L back, hip, leg, knee. Naproxyn helped a little bit and is definitely better than yesterday. It did ease up yesterday while she was working after taking medication. Reports current pain 5/10 at L groin, posterior hip, back, and left knee (which hasn't hurt since she got the cortisone shot). Knee pain is at the proximal pole of the patella and also feels swollen.    Pertinent History Patient is a 65 y.o. female who presents to outpatient physical therapy with a referral for medical diagnosis chronic left-sided low back pain with left-sided sciatica. This patient's chief complaints consist of left low back pain with radiation towards left LE, left hip and knee pain leading to the following functional deficits: difficulty with all of her usual activities, making them slower,  such as dressing, working, stairs, weight bearing, standing, walking, etc, and decreases her quality of life.   Relevant past medical history and comorbidities include BPPV, hx of hysterectomy and left sided sciatica.  Patient denies hx of cancer, stroke, seizures, lung problem, major cardiac events, diabetes, unexplained weight loss, changes in bowel or bladder problems, new onset stumbling or dropping things, spine surgery.    Limitations Lifting;Standing;Walking;House hold activities   Functional Limitations: difficulty with all of her usual activities, making them slower, such as dressing, lifting, bending, working, stairs, weight bearing, standing, walking, etc, and decreases her quality of life.   How long can you stand comfortably? 1 or 1/2 lap around mall    Diagnostic tests Lumbar MRI report 12/22/2020: "  IMPRESSION:  1. Mild multifactorial spinal stenosis at L4-5 with asymmetric  narrowing of the left lateral recess and possible left L5 nerve root  encroachment.  2. Mild asymmetric narrowing of the left foramen at L5-S1 with  possible left L5 nerve root encroachment."    Patient Stated Goals "get rid of this pain"    Currently in Pain? Yes    Pain Score 5     Pain Onset More than a month ago           TREATMENT:   Therapeutic exercise: to centralize symptoms and improve ROM, strength, muscular endurance, and activity tolerance required for successful completion of functional activities.   -  standing lumbar extension with support behind 1x10.produced groin and low back pain no worse.    Prone with one pillow under hips:  - prone abdominal brace, 5 second hold, 1x10 - prone abdominal brace with alternating hip extension (multiffidus kick), 1x10 each side plus ~ 5 reps each side to learn technique.   Hooklying:  - posterior pelvic tilt (PPT), 1x10 (stopped due to left sided pain).   Seated:  - lumbar flexion with theraball, x 15 (feels okay) - PPT with abdominal brace, 1x10, 5  second hold - PPT with abdominal brace and alternating marching, 1x10 each side. (seems well tolerated).   - Education on HEP including handout   Manual therapy: to reduce pain and tissue tension, improve range of motion, neuromodulation, in order to promote improved ability to complete functional activities. - supine long axis distraction through left LE, caused increased low back pain and sharpness in the groin.  - hooklying R hip mobilization grade II-IV, lateral and caudal, discontinued due to pain at low back and glute with both of those.  - prone CPA to thoracic and lumbar spine, grade II-IV, to improve motion, decrease pain. (uncomfortable overall with no decrease in pain with repetition) - prone STM to lumbar and thoracic paraspinals L > R, L QL, left glutes and deep hip rotators. (uncomfortable overall).     HOME EXERCISE PROGRAM  Access Code: [email or web address] URL: [email or web address] Date: 01/11/2021 Prepared by: Norton Blizzard  Exercises Seated Posterior Pelvic Tilt - 1 x daily - 1 sets - 10 reps - 5 seconds hold Seated March - 1 x daily - 2 sets - 10 reps - 1-5 second hold hold     PT Education - 01/11/21 1651    Education Details examination purpose/form. Self management techniques    Person(s) Educated Patient    Methods Explanation;Demonstration;Tactile cues;Verbal cues    Comprehension Verbalized understanding;Returned demonstration;Verbal cues required;Tactile cues required;Need further instruction            PT Short Term Goals - 01/09/21 1857      PT SHORT TERM GOAL #1   Title Be independent with initial home exercise program for self-management of symptoms.    Baseline Initial HEP to be provided at visit 2 as appropriate (01/09/2021);    Time 3    Period Weeks    Status New    Target Date 01/30/21             PT Long Term Goals - 01/09/21 1855      PT LONG TERM GOAL #1   Title Be independent with a long-term home exercise program for  self-management of symptoms.    Baseline initial HEP to be provided at visit 2 as appropriate (01/09/2021);    Time 12    Period Weeks    Status New   TARGET DATE FOR ALL LONG TERM GOALS: 04/03/2021     PT LONG TERM GOAL #2   Title Demonstrate improved FOTO score to equal or greater than 66 by visit #10 to demonstrate improvement in overall condition and self-reported functional ability.    Baseline 52 (01/07/2021);    Time 12    Period Weeks    Status New      PT LONG TERM GOAL #3   Title Have full lumbar and left hip AROM with no compensations or increase in pain in all planes except intermittent end range discomfort to allow patient to complete valued activities with less difficulty.    Baseline painful and limited -  see objective (01/09/2021);    Time 12    Period Weeks    Status New      PT LONG TERM GOAL #4   Title Improve B LE  strength with no increase in pain to 5/5 for improved ability to allow patient to complete valued functional tasks such as work and stairs without difficulty.    Baseline painful and weak - see objective exam (01/09/2021);    Time 12    Period Weeks    Status New      PT LONG TERM GOAL #5   Title Complete community, work and/or recreational activities without limitation due to current condition.    Baseline difficulty with all of her usual activities, making them slower, such as dressing, lifting, bending, working, stairs, weight bearing, standing, walking (01/09/2021);    Time 12    Period Weeks    Status New      Additional Long Term Goals   Additional Long Term Goals Yes      PT LONG TERM GOAL #6   Title Reduce pain with functional activities to equal or less than 1/10 to allow patient to complete usual activities including ADLs, IADLs, and social engagement with less difficulty.    Baseline 12/10 (01/09/2021);    Time 12    Period Weeks    Status New                 Plan - 01/11/21 1939    Clinical Impression Statement Patient  unresponsive to manual techniques utilized to decrease pain and tension. Had some difficulty with supine exercise due to pain possibly due to pressure on sore regions of pelvis from bed. Was mildly limited by need to keep head up related to pt concerns about provoking vertigo with head down position as has happened in the past. Seemed to tolerate sitting flexion and stabilization exercises well, so added to HEP. Patient noted to be favoring L LE with antalgic gait on her way out of clinic. Overall, struggled with tolerance for PT this session and seems to have some trouble communicating/articulating discomfort at times. Recommend caution with extension and focus on flexion based stabilization and mobility exercise with low neural tension positioning in L LE. Patient would benefit from continued management of limiting condition by skilled physical therapist to address remaining impairments and functional limitations to work towards stated goals and return to PLOF or maximal functional independence.    Personal Factors and Comorbidities Age;Past/Current Experience;Comorbidity 1;Profession;Time since onset of injury/illness/exacerbation;Comorbidity 3+    Comorbidities BPPV, hx of L sided sciatica, hysterectomy    Examination-Activity Limitations Bend;Squat;Hygiene/Grooming;Bathing;Lift;Stairs;Locomotion Level;Stand;Caring for Others;Carry;Transfers;Dressing    Examination-Participation Restrictions Interpersonal Relationship;Occupation;Driving;Community Activity;Cleaning;Shop;Laundry;Yard Work   difficulty with all of her usual activities, making them slower, such as dressing, lifting, bending, working, stairs, weight bearing, standing, walking, etc, and decreases her quality of life.   Stability/Clinical Decision Making Evolving/Moderate complexity    Rehab Potential Good    PT Frequency 2x / week    PT Duration 12 weeks    PT Treatment/Interventions ADLs/Self Care Home Management;Aquatic  Therapy;Cryotherapy;Moist Heat;Electrical Stimulation;Neuromuscular re-education;Therapeutic exercise;Therapeutic activities;Patient/family education;Manual techniques;Dry needling;Passive range of motion;Spinal Manipulations;Joint Manipulations    PT Next Visit Plan Flexion based stabilization exercises, nerve glides when appropriate    PT Home Exercise Plan Medbridge Access Code: [email or web address]    Consulted and Agree with Plan of Care Patient           Patient will benefit from skilled therapeutic intervention in order to improve  the following deficits and impairments:  Abnormal gait,Increased fascial restricitons,Dizziness,Pain,Decreased mobility,Increased muscle spasms,Decreased activity tolerance,Decreased endurance,Decreased range of motion,Decreased strength,Hypomobility,Impaired perceived functional ability,Impaired flexibility,Difficulty walking  Visit Diagnosis: Chronic left-sided low back pain with left-sided sciatica  Pain in left hip  Difficulty in walking, not elsewhere classified     Problem List Patient Active Problem List   Diagnosis Date Noted  . Low back pain 12/05/2020  . Pain in left leg 11/07/2020    Luretha MurphySara R. Ilsa IhaSnyder, PT, DPT 01/11/21, 7:41 PM  [facility] [address] [place], [place], [address] Phone: [phone]   Fax:  [phone]  Name: Allen NorrisDebra C Odonoghue MRN: [id number] Date of Birth: 1956/08/13

## 2021-01-15 ENCOUNTER — Ambulatory Visit: Payer: BC Managed Care – PPO | Admitting: Physical Medicine and Rehabilitation

## 2021-01-15 ENCOUNTER — Encounter: Payer: Self-pay | Admitting: Physical Medicine and Rehabilitation

## 2021-01-15 ENCOUNTER — Ambulatory Visit: Payer: Self-pay

## 2021-01-15 ENCOUNTER — Other Ambulatory Visit: Payer: Self-pay

## 2021-01-15 VITALS — BP 128/74 | HR 68

## 2021-01-15 DIAGNOSIS — M5416 Radiculopathy, lumbar region: Secondary | ICD-10-CM

## 2021-01-15 MED ORDER — BETAMETHASONE SOD PHOS & ACET 6 (3-3) MG/ML IJ SUSP
12.0000 mg | Freq: Once | INTRAMUSCULAR | Status: AC
  Administered 2021-01-15: 12 mg

## 2021-01-15 NOTE — Progress Notes (Signed)
Kathryn Hensley - 65 y.o. female MRN [id number]  Date of birth: 1956/05/24  Office Visit Note: Visit Date: 01/15/2021 PCP: Pcp, No Referred by: Valeria Batman, MD  Subjective: Chief Complaint  Patient presents with  . Lower Back - Pain  . Left Thigh - Pain  . Left Knee - Pain   HPI:  Kathryn Hensley is a 65 y.o. female who comes in today at the request of Dr. Norlene Campbell for planned Left L5-S1 Lumbar epidural steroid injection with fluoroscopic guidance.  The patient has failed conservative care including home exercise, medications, time and activity modification.  This injection will be diagnostic and hopefully therapeutic.  Please see requesting physician notes for further details and justification. MRI reviewed with images and spine model.  MRI reviewed in the note below.  She does get pain into the left groin particular going from sit to stand.  With prolonged standing she does get referral in the groin as well.  Depending on relief consider intra-articular hip injection with question of labral issue.    ROS Otherwise per HPI.  Assessment & Plan: Visit Diagnoses:    ICD-10-CM   1. Lumbar radiculopathy  M54.16 XR C-ARM NO REPORT    Epidural Steroid injection    betamethasone acetate-betamethasone sodium phosphate (CELESTONE) injection 12 mg    Plan: No additional findings.   Meds & Orders:  Meds ordered this encounter  Medications  . betamethasone acetate-betamethasone sodium phosphate (CELESTONE) injection 12 mg    Orders Placed This Encounter  Procedures  . XR C-ARM NO REPORT  . Epidural Steroid injection    Follow-up: Return for visit to requesting physician as needed.   Procedures: No procedures performed  Lumbosacral Transforaminal Epidural Steroid Injection - Sub-Pedicular Approach with Fluoroscopic Guidance  Patient: Kathryn Hensley      Date of Birth: [birth year] MRN: [id number] PCP: Pcp, No      Visit Date: 01/15/2021   Universal Protocol:     Date/Time: 01/15/2021  Consent Given By: the patient  Position: PRONE  Additional Comments: Vital signs were monitored before and after the procedure. Patient was prepped and draped in the usual sterile fashion. The correct patient, procedure, and site was verified.   Injection Procedure Details:   Procedure diagnoses: Lumbar radiculopathy [M54.16]    Meds Administered:  Meds ordered this encounter  Medications  . betamethasone acetate-betamethasone sodium phosphate (CELESTONE) injection 12 mg    Laterality: Left  Location/Site:  L5-S1  Needle:5.0 in., 22 ga.  Short bevel or Quincke spinal needle  Needle Placement: Transforaminal  Findings:    -Comments: Excellent flow of contrast along the nerve, nerve root and into the epidural space.  Procedure Details: After squaring off the end-plates to get a true AP view, the C-arm was positioned so that an oblique view of the foramen as noted above was visualized. The target area is just inferior to the "nose of the scotty dog" or sub pedicular. The soft tissues overlying this structure were infiltrated with 2-3 ml. of 1% Lidocaine without Epinephrine.  The spinal needle was inserted toward the target using a "trajectory" view along the fluoroscope beam.  Under AP and lateral visualization, the needle was advanced so it did not puncture dura and was located close the 6 O'Clock position of the pedical in AP tracterory. Biplanar projections were used to confirm position. Aspiration was confirmed to be negative for CSF and/or blood. A 1-2 ml. volume of Isovue-250 was injected and flow of contrast was  noted at each level. Radiographs were obtained for documentation purposes.   After attaining the desired flow of contrast documented above, a 0.5 to 1.0 ml test dose of 0.25% Marcaine was injected into each respective transforaminal space.  The patient was observed for 90 seconds post injection.  After no sensory deficits were reported, and  normal lower extremity motor function was noted,   the above injectate was administered so that equal amounts of the injectate were placed at each foramen (level) into the transforaminal epidural space.   Additional Comments:  The patient tolerated the procedure well Dressing: 2 x 2 sterile gauze and Band-Aid    Post-procedure details: Patient was observed during the procedure. Post-procedure instructions were reviewed.  Patient left the clinic in stable condition.      Clinical History: MRI LUMBAR SPINE WITHOUT CONTRAST  TECHNIQUE: Multiplanar, multisequence MR imaging of the lumbar spine was performed. No intravenous contrast was administered.  COMPARISON:  Lumbar spine radiographs 12/05/2020.  FINDINGS: Segmentation: Conventional anatomy assumed, with the last open disc space designated L5-S1.Concordant with prior imaging.  Alignment:  Physiologic.  Vertebrae: No worrisome osseous lesion, acute fracture or pars defect. Mild endplate degenerative changes at the L4-5 and L5-S1 levels. The visualized sacroiliac joints appear unremarkable.  Conus medullaris: Extends to the L1-2 level and appears normal.  Paraspinal and other soft tissues: No significant paraspinal findings.  Disc levels:  No significant disc space findings from T11-12 through L2-3.  L3-4: Disc height and hydration are maintained. Mild disc bulging and facet hypertrophy. No spinal stenosis or nerve root encroachment.  L4-5: Loss of disc height with annular disc bulging and a small central disc protrusion. Mild facet and ligamentous hypertrophy. These factors contribute to mild spinal stenosis with asymmetric narrowing of the left lateral recess and possible left L5 nerve root encroachment. The foramina appear sufficiently patent, and no L4 nerve root encroachment identified.  L5-S1: Loss of disc height with disc bulging and endplate osteophytes asymmetric to the left. Mild facet and  ligamentous hypertrophy. Mild asymmetric narrowing of the left foramen which could contribute to foraminal left L5 nerve root encroachment. The spinal canal, lateral recesses and right foramen are patent.  IMPRESSION: 1. Mild multifactorial spinal stenosis at L4-5 with asymmetric narrowing of the left lateral recess and possible left L5 nerve root encroachment. 2. Mild asymmetric narrowing of the left foramen at L5-S1 with possible left L5 nerve root encroachment.   Electronically Signed   By: Carey Bullocks M.D.   On: 12/23/2020 10:37     Objective:  VS:  HT:    WT:   BMI:     BP:128/74  HR:68bpm  TEMP: ( )  RESP:  Physical Exam Vitals and nursing note reviewed.  Constitutional:      General: She is not in acute distress.    Appearance: Normal appearance. She is not ill-appearing.  HENT:     Head: Normocephalic and atraumatic.     Right Ear: External ear normal.     Left Ear: External ear normal.  Eyes:     Extraocular Movements: Extraocular movements intact.  Cardiovascular:     Rate and Rhythm: Normal rate.     Pulses: Normal pulses.  Pulmonary:     Effort: Pulmonary effort is normal. No respiratory distress.  Abdominal:     General: There is no distension.     Palpations: Abdomen is soft.  Musculoskeletal:        General: Tenderness present.     Cervical back:  Neck supple.     Right lower leg: No edema.     Left lower leg: No edema.     Comments: Patient has good distal strength with no pain over the greater trochanters.  No clonus or focal weakness.  Skin:    Findings: No erythema, lesion or rash.  Neurological:     General: No focal deficit present.     Mental Status: She is alert and oriented to person, place, and time.     Sensory: No sensory deficit.     Motor: No weakness or abnormal muscle tone.     Coordination: Coordination normal.  Psychiatric:        Mood and Affect: Mood normal.        Behavior: Behavior normal.      Imaging: No  results found.

## 2021-01-15 NOTE — Progress Notes (Signed)
Pt state lower back pain that travels to her left groin area down to her left knee. Pt state walking, standing and sitting make the pain worse. Pt state she take pain meds and uses heating pads to help ease the pain.  Numeric Pain Rating Scale and Functional Assessment Average Pain 7   In the last MONTH (on 0-10 scale) has pain interfered with the following?  1. General activity like being  able to carry out your everyday physical activities such as walking, climbing stairs, carrying groceries, or moving a chair?  Rating(9)   +Driver, -BT, -Dye Allergies.

## 2021-01-15 NOTE — Patient Instructions (Signed)

## 2021-01-15 NOTE — Procedures (Signed)
Lumbosacral Transforaminal Epidural Steroid Injection - Sub-Pedicular Approach with Fluoroscopic Guidance  Patient: Kathryn Hensley      Date of Birth: 08-14-1956 MRN: [id number] PCP: Pcp, No      Visit Date: 01/15/2021   Universal Protocol:    Date/Time: 01/15/2021  Consent Given By: the patient  Position: PRONE  Additional Comments: Vital signs were monitored before and after the procedure. Patient was prepped and draped in the usual sterile fashion. The correct patient, procedure, and site was verified.   Injection Procedure Details:   Procedure diagnoses: Lumbar radiculopathy [M54.16]    Meds Administered:  Meds ordered this encounter  Medications  . betamethasone acetate-betamethasone sodium phosphate (CELESTONE) injection 12 mg    Laterality: Left  Location/Site:  L5-S1  Needle:5.0 in., 22 ga.  Short bevel or Quincke spinal needle  Needle Placement: Transforaminal  Findings:    -Comments: Excellent flow of contrast along the nerve, nerve root and into the epidural space.  Procedure Details: After squaring off the end-plates to get a true AP view, the C-arm was positioned so that an oblique view of the foramen as noted above was visualized. The target area is just inferior to the "nose of the scotty dog" or sub pedicular. The soft tissues overlying this structure were infiltrated with 2-3 ml. of 1% Lidocaine without Epinephrine.  The spinal needle was inserted toward the target using a "trajectory" view along the fluoroscope beam.  Under AP and lateral visualization, the needle was advanced so it did not puncture dura and was located close the 6 O'Clock position of the pedical in AP tracterory. Biplanar projections were used to confirm position. Aspiration was confirmed to be negative for CSF and/or blood. A 1-2 ml. volume of Isovue-250 was injected and flow of contrast was noted at each level. Radiographs were obtained for documentation purposes.   After attaining  the desired flow of contrast documented above, a 0.5 to 1.0 ml test dose of 0.25% Marcaine was injected into each respective transforaminal space.  The patient was observed for 90 seconds post injection.  After no sensory deficits were reported, and normal lower extremity motor function was noted,   the above injectate was administered so that equal amounts of the injectate were placed at each foramen (level) into the transforaminal epidural space.   Additional Comments:  The patient tolerated the procedure well Dressing: 2 x 2 sterile gauze and Band-Aid    Post-procedure details: Patient was observed during the procedure. Post-procedure instructions were reviewed.  Patient left the clinic in stable condition.

## 2021-01-16 ENCOUNTER — Encounter: Payer: Self-pay | Admitting: Physical Therapy

## 2021-01-16 ENCOUNTER — Ambulatory Visit: Payer: BC Managed Care – PPO | Admitting: Physical Therapy

## 2021-01-16 DIAGNOSIS — R262 Difficulty in walking, not elsewhere classified: Secondary | ICD-10-CM

## 2021-01-16 DIAGNOSIS — M5442 Lumbago with sciatica, left side: Secondary | ICD-10-CM | POA: Diagnosis not present

## 2021-01-16 DIAGNOSIS — G8929 Other chronic pain: Secondary | ICD-10-CM

## 2021-01-16 DIAGNOSIS — M25552 Pain in left hip: Secondary | ICD-10-CM

## 2021-01-16 NOTE — Therapy (Signed)
[facility] PHYS[facility] AND SPOR[facility] MEDICINE [address] [place], [address] Phone: [phone]   Fax:  [phone]  Physical Therapy Treatment  Patient Details  Name: Kathryn Hensley MRN: [id number] Date of Birth: 1956/01/06 Referring Provider (PT): Valeria Batman, MD (orthopedics)   Encounter Date: 01/16/2021   PT End of Session - 01/16/21 1811    Visit Number 3    Number of Visits 24    Date for PT Re-Evaluation 04/03/21    Authorization Type [employer] reporting period from 01/09/2021    Authorization - Visit Number 3    Authorization - Number of Visits 30   PT/OT/Chiro   Progress Note Due on Visit 10    PT Start Time 1515    PT Stop Time 1555    PT Time Calculation (min) 40 min    Activity Tolerance Patient limited by pain    Behavior During Therapy [facility] for tasks assessed/performed           History reviewed. No pertinent past medical history.  History reviewed. No pertinent surgical history.  There were no vitals filed for this visit.   Subjective Assessment - 01/16/21 1518    Subjective Patient reports she had her cortisone shot yesterday and her back and leg feels a lot better. 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 She has no pain currently. At the end of the session states her doctor said PT would be only 2-3 sessions to teach her exercises at home and she would like her next appointment to be her last.    Pertinent History Patient is a 65 y.o. female who presents to outpatient physical therapy with a referral for medical diagnosis chronic left-sided low back pain with left-sided sciatica. This patient's chief complaints consist of left low back pain with radiation towards left LE, left hip and knee pain leading to the following functional deficits: difficulty with all of her usual activities, making them slower, such  as dressing, working, stairs, weight bearing, standing, walking, etc, and decreases her quality of life.   Relevant past medical history and comorbidities include BPPV, hx of hysterectomy and left sided sciatica.  Patient denies hx of cancer, stroke, seizures, lung problem, major cardiac events, diabetes, unexplained weight loss, changes in bowel or bladder problems, new onset stumbling or dropping things, spine surgery.    Limitations Lifting;Standing;Walking;House hold activities   Functional Limitations: difficulty with all of her usual activities, making them slower, such as dressing, lifting, bending, working, stairs, weight bearing, standing, walking, etc, and decreases her quality of life.   How long can you stand comfortably? 1 or 1/2 lap around mall    Diagnostic tests Lumbar MRI report 12/22/2020: "  IMPRESSION:  1. Mild multifactorial spinal stenosis at L4-5 with asymmetric  narrowing of the left lateral recess and possible left L5 nerve root  encroachment.  2. Mild asymmetric narrowing of the left foramen at L5-S1 with  possible left L5 nerve root encroachment."    Patient Stated Goals "get rid of this pain"    Currently in Pain? No/denies    Pain Onset More than a month ago            TREATMENT:  Denies sensitivity to latex  Therapeutic exercise:to centralize symptoms and improve ROM, strength, muscular endurance, and activity tolerance required for successful completion of functional activities.  Hooklying:  - posterior pelvic tilt (PPT), 1x10, 5 second hold.   Seated:  - abdominal brace with hip abduction against GTB, 3 second hold, 1x20  Seated on clear theraball, circuit: - abdominal brace with marching, 2x10 each side - abdominal brace moving 2kg med ball side to side, 2x10 each side - abdominal brace with bicep curl, 2x10 each side - abdominal brace with pallof press, red theraband loop 2x10 each side  - Squats with BUE support focusing on glute activation and  proper form with hip hinging, stabilized back, and tibial perpendicular to floor with knees behind toes. 1x10 - standing leaning over mat: hip extension 1x10 each side with abdominal brace (difficult).   - NuStep level 0 using bilateral upper and lower extremities. Seat/handle setting 11. For improved extremity mobility, muscular endurance, and activity tolerance; and to induce the analgesic effect of aerobic exercise, stimulate improved joint nutrition. x 4  minutes. (Feels pulling B hamstrings). Average SPM = 52  Pt required multimodal cuing for proper technique and to facilitate improved neuromuscular control, strength, range of motion, and functional ability resulting in improved performance and form.  Pt declined manual therapy today   HOME EXERCISE PROGRAM  Access Code: [email or web address] URL: [email or web address] Date: 01/11/2021 Prepared by: Norton BlizzardSara Trinita Devlin  Exercises Seated Posterior Pelvic Tilt - 1 x daily - 1 sets - 10 reps - 5 seconds hold Seated March - 1 x daily - 2 sets - 10 reps - 1-5 second hold hold     PT Education - 01/16/21 1811    Education Details exercise purpose/form. Self management techniques    Person(s) Educated Patient    Methods Explanation;Demonstration;Tactile cues;Verbal cues    Comprehension Verbalized understanding;Returned demonstration;Verbal cues required;Tactile cues required;Need further instruction            PT Short Term Goals - 01/09/21 1857      PT SHORT TERM GOAL #1   Title Be independent with initial home exercise program for self-management of symptoms.    Baseline Initial HEP to be provided at visit 2 as appropriate (01/09/2021);    Time 3    Period Weeks    Status New    Target Date 01/30/21             PT Long Term Goals - 01/09/21 1855      PT LONG TERM GOAL #1   Title Be independent with a long-term home exercise program for self-management of symptoms.    Baseline initial HEP to be provided at visit 2 as  appropriate (01/09/2021);    Time 12    Period Weeks    Status New   TARGET DATE FOR ALL LONG TERM GOALS: 04/03/2021     PT LONG TERM GOAL #2   Title Demonstrate improved FOTO score to equal or greater than 66 by visit #10 to demonstrate improvement in overall condition and self-reported functional ability.    Baseline 52 (01/07/2021);    Time 12    Period Weeks    Status New      PT LONG TERM GOAL #3   Title Have full lumbar and left hip AROM with no compensations or increase in pain in all planes except intermittent end range discomfort to allow patient to complete valued activities with less difficulty.    Baseline painful and limited - see objective (01/09/2021);    Time 12    Period Weeks    Status New      PT LONG TERM GOAL #4  Title Improve B LE  strength with no increase in pain to 5/5 for improved ability to allow patient to complete valued functional tasks such as work and stairs without difficulty.    Baseline painful and weak - see objective exam (01/09/2021);    Time 12    Period Weeks    Status New      PT LONG TERM GOAL #5   Title Complete community, work and/or recreational activities without limitation due to current condition.    Baseline difficulty with all of her usual activities, making them slower, such as dressing, lifting, bending, working, stairs, weight bearing, standing, walking (01/09/2021);    Time 12    Period Weeks    Status New      Additional Long Term Goals   Additional Long Term Goals Yes      PT LONG TERM GOAL #6   Title Reduce pain with functional activities to equal or less than 1/10 to allow patient to complete usual activities including ADLs, IADLs, and social engagement with less difficulty.    Baseline 12/10 (01/09/2021);    Time 12    Period Weeks    Status New                 Plan - 01/16/21 1554    Clinical Impression Statement Patient tolerated treatment with some difficulty due to quick fatigue and difficulty with motor  control and effort required to complete movements during session. Reports soreness developing in arms with ball activities. Focused on mid range core stability and LE strength as well as overall improved activity tolerance. Patient with spinal injection yesterday that appears to have improved her pain drastically but she has not yet returned to prior level of function. Plan to focus on updating HEP and discharging at next session as requested by pt. Patient would benefit from continued management of limiting condition by skilled physical therapist to address remaining impairments and functional limitations to work towards stated goals and return to PLOF or maximal functional independence.    Personal Factors and Comorbidities Age;Past/Current Experience;Comorbidity 1;Profession;Time since onset of injury/illness/exacerbation;Comorbidity 3+    Comorbidities BPPV, hx of L sided sciatica, hysterectomy    Examination-Activity Limitations Bend;Squat;Hygiene/Grooming;Bathing;Lift;Stairs;Locomotion Level;Stand;Caring for Others;Carry;Transfers;Dressing    Examination-Participation Restrictions Interpersonal Relationship;Occupation;Driving;Community Activity;Cleaning;Shop;Laundry;Yard Work   difficulty with all of her usual activities, making them slower, such as dressing, lifting, bending, working, stairs, weight bearing, standing, walking, etc, and decreases her quality of life.   Stability/Clinical Decision Making Evolving/Moderate complexity    Rehab Potential Good    PT Frequency 2x / week    PT Duration 12 weeks    PT Treatment/Interventions ADLs/Self Care Home Management;Aquatic Therapy;Cryotherapy;Moist Heat;Electrical Stimulation;Neuromuscular re-education;Therapeutic exercise;Therapeutic activities;Patient/family education;Manual techniques;Dry needling;Passive range of motion;Spinal Manipulations;Joint Manipulations    PT Next Visit Plan Flexion based stabilization exercises, nerve glides when  appropriate. Discharge with updated HEP as requested by pt.    PT Home Exercise Plan Medbridge Access Code: [email or web address]    Consulted and Agree with Plan of Care Patient           Patient will benefit from skilled therapeutic intervention in order to improve the following deficits and impairments:  Abnormal gait,Increased fascial restricitons,Dizziness,Pain,Decreased mobility,Increased muscle spasms,Decreased activity tolerance,Decreased endurance,Decreased range of motion,Decreased strength,Hypomobility,Impaired perceived functional ability,Impaired flexibility,Difficulty walking  Visit Diagnosis: Chronic left-sided low back pain with left-sided sciatica  Pain in left hip  Difficulty in walking, not elsewhere classified     Problem List Patient Active Problem List   Diagnosis  Date Noted  . Low back pain 12/05/2020  . Pain in left leg 11/07/2020    Luretha Murphy. Ilsa Iha, PT, DPT 01/16/21, 6:15 PM  [facility] PHYSICAL AND SPORTS MEDICINE [address] [place], [address] Phone: [phone]   Fax:  [phone]  Name: Kathryn Hensley MRN: [id number] Date of Birth: Oct 25, 1956

## 2021-01-18 ENCOUNTER — Encounter: Payer: Self-pay | Admitting: Physical Therapy

## 2021-01-18 ENCOUNTER — Other Ambulatory Visit: Payer: Self-pay

## 2021-01-18 ENCOUNTER — Ambulatory Visit: Payer: BC Managed Care – PPO | Admitting: Physical Therapy

## 2021-01-18 DIAGNOSIS — R262 Difficulty in walking, not elsewhere classified: Secondary | ICD-10-CM

## 2021-01-18 DIAGNOSIS — M5442 Lumbago with sciatica, left side: Secondary | ICD-10-CM | POA: Diagnosis not present

## 2021-01-18 DIAGNOSIS — M25552 Pain in left hip: Secondary | ICD-10-CM

## 2021-01-18 DIAGNOSIS — G8929 Other chronic pain: Secondary | ICD-10-CM

## 2021-01-18 NOTE — Therapy (Signed)
[place] [facility] PH[facility] AND SPORTS MEDICINE [address] [place], [address] Phone: [phone]   Fax:  [phone]  Physical Therapy Treatment / Discharge Summary Dates of reporting: 01/09/2021 - 01/18/2021  Patient Details  Name: Kathryn Hensley MRN: [id number] Date of Birth: [birth year] Referring Provider (PT): Garald Balding, MD (orthopedics)   Encounter Date: 01/18/2021   PT End of Session - 01/18/21 1538    Visit Number 4    Number of Visits 24    Date for PT Re-Evaluation 04/03/21    Authorization Type [employer] reporting period from 01/09/2021    Authorization - Visit Number 4    Authorization - Number of Visits 30   PT/OT/Chiro   Progress Note Due on Visit 10    PT Start Time 1520    PT Stop Time 1550    PT Time Calculation (min) 30 min    Activity Tolerance Patient tolerated treatment well    Behavior During Therapy [facility] for tasks assessed/performed           History reviewed. No pertinent past medical history.  History reviewed. No pertinent surgical history.  There were no vitals filed for this visit.   Subjective Assessment - 01/18/21 1605    Subjective Patient reports she has been feeling really good and felt okay after last treatment session. Reports pain over the last few days has been lower than 1/10. Would like to update her HEP today and discharge at this session.    Pertinent History Patient is a 65 y.o. female who presents to outpatient physical therapy with a referral for medical diagnosis chronic left-sided low back pain with left-sided sciatica. This patient's chief complaints consist of left low back pain with radiation towards left LE, left hip and knee pain leading to the following functional deficits: difficulty with all of her usual activities, making them slower, such as dressing, working, stairs, weight bearing, standing, walking, etc, and decreases her quality of life.   Relevant past medical history and  comorbidities include BPPV, hx of hysterectomy and left sided sciatica.  Patient denies hx of cancer, stroke, seizures, lung problem, major cardiac events, diabetes, unexplained weight loss, changes in bowel or bladder problems, new onset stumbling or dropping things, spine surgery.    Limitations Lifting;Standing;Walking;House hold activities   Functional Limitations: difficulty with all of her usual activities, making them slower, such as dressing, lifting, bending, working, stairs, weight bearing, standing, walking, etc, and decreases her quality of life.   How long can you stand comfortably? 1 or 1/2 lap around mall    Diagnostic tests Lumbar MRI report 12/22/2020: "  IMPRESSION:  1. Mild multifactorial spinal stenosis at L4-5 with asymmetric  narrowing of the left lateral recess and possible left L5 nerve root  encroachment.  2. Mild asymmetric narrowing of the left foramen at L5-S1 with  possible left L5 nerve root encroachment."    Patient Stated Goals "get rid of this pain"    Currently in Pain? No/denies    Pain Onset More than a month ago            OBJECTIVE  FOTO = 72   SPINE MOTION Lumbar AROM *Indicates pain  Flexion: = ankles, tightness in back of legs, not concordant.  Extension: = 75% no concordant pain,  Rotation: B = WFL  Side Flexion: B = WFL    TREATMENT: Denies sensitivity to latex  Therapeutic exercise:to centralize symptoms and improve ROM, strength, muscular endurance, and activity  tolerance required for successful completion of functional activities.  - standing hinging at hip holding chair (to simulate how she can do this at home): hip extension 2x10 each side with abdominal brace (difficult to control back).  - Squats with BUE support focusing on glute activation and proper form with hip hinging, stabilized back, and tibial perpendicular to floor with knees behind toes. 2x10 - Standing pallof press (multifidus press) with greentheraband 2x10 each  side.  - reviewed lumbar flexion roll out.  - examination of lumbar ROM to assess progress (see above) - Education on HEP including handout   Pt required multimodal cuing for proper technique and to facilitate improved neuromuscular control, strength, range of motion, and functional ability resulting in improved performance and form.  HOME EXERCISE PROGRAM Access Code: [email or web address] URL: [email or web address] Date: 01/18/2021 Prepared by: Rosita Kea  Exercises Seated Posterior Pelvic Tilt - 1 x daily - 1 sets - 10 reps - 5 seconds hold Seated March - 1 x daily - 2 sets - 10 reps - 1-5 second hold hold Prone Hip Extension on Table - 3-7 x weekly - 1-3 sets - 10 reps - 2 seconds hold Squat with Counter Support - 3-7 x weekly - 1-3 sets - 10 reps Seated Anti-Rotation Press with Anchored Resistance - 3-7 x weekly - 1-3 sets - 10 reps - 2 seconds hold Seated lumbar flexion with ball x 2-5 min ([email or web address])    PT Education - 01/18/21 1949    Education Details exercise purpose/form. Self management techniques. discharge reccomendations.    Person(s) Educated Patient    Methods Explanation;Demonstration;Tactile cues;Verbal cues;Handout    Comprehension Verbalized understanding;Returned demonstration            PT Short Term Goals - 01/18/21 1955      PT SHORT TERM GOAL #1   Title Be independent with initial home exercise program for self-management of symptoms.    Baseline Initial HEP to be provided at visit 2 as appropriate (01/09/2021);    Time 3    Period Weeks    Status Achieved    Target Date 01/30/21             PT Long Term Goals - 01/18/21 1956      PT LONG TERM GOAL #1   Title Be independent with a long-term home exercise program for self-management of symptoms.    Baseline initial HEP to be provided at visit 2 as appropriate (01/09/2021);    Time 12    Period Weeks    Status Achieved   TARGET DATE FOR ALL LONG TERM GOALS: 04/03/2021     PT LONG TERM  GOAL #2   Title Demonstrate improved FOTO score to equal or greater than 66 by visit #10 to demonstrate improvement in overall condition and self-reported functional ability.    Baseline 52 (01/07/2021); 72 (01/18/2021);    Time 12    Period Weeks    Status Achieved      PT LONG TERM GOAL #3   Title Have full lumbar and left hip AROM with no compensations or increase in pain in all planes except intermittent end range discomfort to allow patient to complete valued activities with less difficulty.    Baseline painful and limited - see objective (01/09/2021); improved - see objective (01/18/2021);    Time 12    Period Weeks    Status Partially Met      PT LONG TERM GOAL #4   Title Improve B LE  strength with  no increase in pain to 5/5 for improved ability to allow patient to complete valued functional tasks such as work and stairs without difficulty.    Baseline painful and weak - see objective exam (01/09/2021); not retested (01/18/2021);    Time 12    Period Weeks    Status Unable to assess      PT LONG TERM GOAL #5   Title Complete community, work and/or recreational activities without limitation due to current condition.    Baseline difficulty with all of her usual activities, making them slower, such as dressing, lifting, bending, working, stairs, weight bearing, standing, walking (01/09/2021);    Time 12    Period Weeks    Status Achieved      PT LONG TERM GOAL #6   Title Reduce pain with functional activities to equal or less than 1/10 to allow patient to complete usual activities including ADLs, IADLs, and social engagement with less difficulty.    Baseline 12/10 (01/09/2021); less than 1/10 (01/18/2021);    Time 12    Period Weeks    Status Achieved                 Plan - 01/18/21 1955    Clinical Impression Statement Patient has attended 4 physical therapy sessions this episode of care and is feeling much better after receiving steroid injection earlier in the week. States  she did think she was starting to feel better prior to this as well. Patient requested discharge due to feeling better and wanting to try independent management with HEP at this time. Provided patient with appropriate HEP for her to work on. Patient fatigues easily and does have difficulty with lumbopelvic motor control which may affect her long term success. She understands how to seek further PT if needed in the future. Patient is now discharged from PT due to improvement in condition and patient request.    Personal Factors and Comorbidities Age;Past/Current Experience;Comorbidity 1;Profession;Time since onset of injury/illness/exacerbation;Comorbidity 3+    Comorbidities BPPV, hx of L sided sciatica, hysterectomy    Examination-Activity Limitations Bend;Squat;Hygiene/Grooming;Bathing;Lift;Stairs;Locomotion Level;Stand;Caring for Others;Carry;Transfers;Dressing    Examination-Participation Restrictions Interpersonal Relationship;Occupation;Driving;Community Activity;Cleaning;Shop;Laundry;Yard Work   difficulty with all of her usual activities, making them slower, such as dressing, lifting, bending, working, stairs, weight bearing, standing, walking, etc, and decreases her quality of life.   Stability/Clinical Decision Making Evolving/Moderate complexity    Rehab Potential Good    PT Frequency 2x / week    PT Duration 12 weeks    PT Treatment/Interventions ADLs/Self Care Home Management;Aquatic Therapy;Cryotherapy;Moist Heat;Electrical Stimulation;Neuromuscular re-education;Therapeutic exercise;Therapeutic activities;Patient/family education;Manual techniques;Dry needling;Passive range of motion;Spinal Manipulations;Joint Manipulations    PT Next Visit Plan Patient is now discharged from [facility] Access Code: [email or web address]    Consulted and Agree with Plan of Care Patient           Patient will benefit from skilled therapeutic intervention in order to improve the following  deficits and impairments:  Abnormal gait,Increased fascial restricitons,Dizziness,Pain,Decreased mobility,Increased muscle spasms,Decreased activity tolerance,Decreased endurance,Decreased range of motion,Decreased strength,Hypomobility,Impaired perceived functional ability,Impaired flexibility,Difficulty walking  Visit Diagnosis: Chronic left-sided low back pain with left-sided sciatica  Pain in left hip  Difficulty in walking, not elsewhere classified     Problem List Patient Active Problem List   Diagnosis Date Noted  . Low back pain 12/05/2020  . Pain in left leg 11/07/2020    Everlean Alstrom. Graylon Good, PT, DPT 01/18/21, 7:58 PM  [facility] [address], [place], [address] Phone: [phone]   Fax:  [phone]  Name: Kathryn Hensley MRN: [id number] Date of Birth: [birth year]

## 2021-01-23 ENCOUNTER — Encounter: Payer: BC Managed Care – PPO | Admitting: Physical Therapy

## 2021-01-25 ENCOUNTER — Encounter: Payer: Self-pay | Admitting: Orthopaedic Surgery

## 2021-01-25 ENCOUNTER — Ambulatory Visit: Payer: BC Managed Care – PPO | Admitting: Orthopaedic Surgery

## 2021-01-25 ENCOUNTER — Other Ambulatory Visit: Payer: Self-pay

## 2021-01-25 ENCOUNTER — Encounter: Payer: BC Managed Care – PPO | Admitting: Physical Therapy

## 2021-01-25 VITALS — Ht 69.0 in | Wt 200.0 lb

## 2021-01-25 DIAGNOSIS — M5442 Lumbago with sciatica, left side: Secondary | ICD-10-CM | POA: Diagnosis not present

## 2021-01-25 DIAGNOSIS — G8929 Other chronic pain: Secondary | ICD-10-CM | POA: Diagnosis not present

## 2021-01-25 NOTE — Progress Notes (Signed)
Office Visit Note   Patient: Kathryn Hensley           Date of Birth: October 08, 1956           MRN: [id number] Visit Date: 01/25/2021              Requested by: No referring provider defined for this encounter. PCP: Pcp, No   Assessment & Plan: Visit Diagnoses:  1. Chronic left-sided low back pain with left-sided sciatica     Plan: Mrs. Madilyn Hook is 10 days status post epidural steroid injection by Dr. Alvester Morin who relates that she is doing "great".  She no longer has any back left buttock or left lower extremity pain or aching.  She wants to return to her normal duty at work.  I have an outline of her work related activities and she did not have any issues with any of them.  I have written a note for her that she can take back to her employer saying that she is perfectly capable performing her full duty activities.  She can always call Dr. Alvester Morin for another injection but presently her pain level is at a 0  Follow-Up Instructions: Return if symptoms worsen or fail to improve.   Orders:  No orders of the defined types were placed in this encounter.  No orders of the defined types were placed in this encounter.     Procedures: No procedures performed   Clinical Data: No additional findings.   Subjective: Chief Complaint  Patient presents with  . Lower Back - Follow-up  Patient presents today for follow up on her lower back. She had a lower back injection with Dr.Newton on 01/15/2021. Patient states that she is doing great, no complaints.  HPI  Review of Systems   Objective: Vital Signs: Ht 5\' 9"  (1.753 m)   Wt 200 lb (90.7 kg)   BMI 29.53 kg/m   Physical Exam Constitutional:      Appearance: She is well-developed.  Eyes:     Pupils: Pupils are equal, round, and reactive to light.  Pulmonary:     Effort: Pulmonary effort is normal.  Skin:    General: Skin is warm and dry.  Neurological:     Mental Status: She is alert and oriented to person, place, and time.   Psychiatric:        Behavior: Behavior normal.     Ortho Exam awake alert and oriented x3.  Comfortable sitting and in no acute distress.  Walks without any limp.  Straight leg raise negative.  Motor and sensory exam intact.  Painless range of motion of both hips and spine, specifically, left hip and no left knee pain.  No percussible tenderness of lumbar spine buttock or left SI joint. Specialty Comments:  No specialty comments available.  Imaging: No results found.   PMFS History: Patient Active Problem List   Diagnosis Date Noted  . Low back pain 12/05/2020  . Pain in left leg 11/07/2020   History reviewed. No pertinent past medical history.  History reviewed. No pertinent family history.  History reviewed. No pertinent surgical history. Social History   Occupational History  . Not on file  Tobacco Use  . Smoking status: Former Smoker    Types: Cigarettes    Quit date: 01/11/2009    Years since quitting: 12.0  . Smokeless tobacco: Never Used  Substance and Sexual Activity  . Alcohol use: Not on file  . Drug use: Not on file  . Sexual  activity: Not on file

## 2022-08-28 ENCOUNTER — Encounter: Payer: Self-pay | Admitting: Orthopaedic Surgery

## 2022-08-28 ENCOUNTER — Ambulatory Visit (INDEPENDENT_AMBULATORY_CARE_PROVIDER_SITE_OTHER): Payer: Medicare Other

## 2022-08-28 ENCOUNTER — Ambulatory Visit (INDEPENDENT_AMBULATORY_CARE_PROVIDER_SITE_OTHER): Payer: Medicare Other | Admitting: Orthopaedic Surgery

## 2022-08-28 ENCOUNTER — Ambulatory Visit: Payer: Medicare Other

## 2022-08-28 DIAGNOSIS — M25562 Pain in left knee: Secondary | ICD-10-CM

## 2022-08-28 DIAGNOSIS — M170 Bilateral primary osteoarthritis of knee: Secondary | ICD-10-CM

## 2022-08-28 DIAGNOSIS — M25561 Pain in right knee: Secondary | ICD-10-CM | POA: Diagnosis not present

## 2022-08-28 DIAGNOSIS — G8929 Other chronic pain: Secondary | ICD-10-CM

## 2022-08-28 MED ORDER — LIDOCAINE HCL 1 % IJ SOLN
2.0000 mL | INTRAMUSCULAR | Status: AC | PRN
  Administered 2022-08-28: 2 mL

## 2022-08-28 MED ORDER — METHYLPREDNISOLONE ACETATE 40 MG/ML IJ SUSP
80.0000 mg | INTRAMUSCULAR | Status: AC | PRN
  Administered 2022-08-28: 80 mg via INTRA_ARTICULAR

## 2022-08-28 MED ORDER — BUPIVACAINE HCL 0.25 % IJ SOLN
2.0000 mL | INTRAMUSCULAR | Status: AC | PRN
  Administered 2022-08-28: 2 mL via INTRA_ARTICULAR

## 2022-08-28 NOTE — Progress Notes (Signed)
Office Visit Note   Patient: Kathryn Hensley           Date of Birth: [birth year]           MRN: [id number] Visit Date: 08/28/2022              Requested by: No referring provider defined for this encounter. PCP: Pcp, No   Assessment & Plan: Visit Diagnoses:  1. Chronic pain of both knees   2. Bilateral primary osteoarthritis of knee     Plan: Kathryn Hensley been experiencing bilateral knee pain and stiffness without injury or trauma.  She has had a fair amount of crepitation beneath both the patella.  She has had some difficulty with bending stooping and squatting.  She has tried over-the-counter anti-inflammatory medicines with some relief.  No swelling.  X-rays demonstrate minimal degenerative changes predominate in the patellofemoral lateral compartments.  We will try a cortisone injection in the right knee and monitor response and have her return in 2 weeks.  If feeling better we could inject the left knee Follow-Up Instructions: Return in about 2 weeks (around 09/11/2022).   Orders:  Orders Placed This Encounter  Procedures   XR KNEE 3 VIEW LEFT   XR KNEE 3 VIEW RIGHT   No orders of the defined types were placed in this encounter.     Procedures: Large Joint Inj: R knee on 08/28/2022 1:42 PM Indications: pain and diagnostic evaluation Details: 25 G 1.5 in needle, anteromedial approach  Arthrogram: No  Medications: 2 mL lidocaine 1 %; 80 mg methylPREDNISolone acetate 40 MG/ML; 2 mL bupivacaine 0.25 % Procedure, treatment alternatives, risks and benefits explained, specific risks discussed. Consent was given by the patient. Immediately prior to procedure a time out was called to verify the correct patient, procedure, equipment, support staff and site/side marked as required. Patient was prepped and draped in the usual sterile fashion.       Clinical Data: No additional findings.   Subjective: Chief Complaint  Patient presents with   Right Knee - Pain   Left Knee -  Pain  Patient presents today for bilateral knee pain. Right hurts more than the left. She said that she has been having this pain for the last month. She has a knot at the posterior aspect of her knee. She has pain all over her knee. The left knee pops. She has rested the last 3 days, with no improvement. She has iced and used Naproxen. She has received cortisone injections in the past. Not diabetic.  HPI  Review of Systems   Objective: Vital Signs: There were no vitals taken for this visit.  Physical Exam Constitutional:      Appearance: She is well-developed.  Eyes:     Pupils: Pupils are equal, round, and reactive to light.  Pulmonary:     Effort: Pulmonary effort is normal.  Skin:    General: Skin is warm and dry.  Neurological:     Mental Status: She is alert and oriented to person, place, and time.  Psychiatric:        Behavior: Behavior normal.     Ortho Exam awake alert and oriented x3.  Comfortable sitting.  Both knees were evaluated without evidence of an effusion.  The knees were not hot warm or red.  The right knee there was more lateral joint pain and minimal discomfort with patella compression.  There was mild patellofemoral discomfort in the left knee.  No instability.  Full quick extension  and flexion well over 100 degrees.  There was a little bit of popliteal pain but no mass formation in the right knee.  No distal edema.  Straight leg raise negative.  Painless range of motion both hips  Specialty Comments:  No specialty comments available.  Imaging: XR KNEE 3 VIEW RIGHT  Result Date: 08/28/2022 Films of the right knee were obtained in 3 projections standing and compared to the left knee.  Appear to be about the same with maintenance of the joint spaces and no ectopic calcification or acute changes.  Very minimal osteophyte formation about the patellofemoral joint and lateral compartment  XR KNEE 3 VIEW LEFT  Result Date: 08/28/2022 Films of the left knee were  obtained in 3 projections standing.  Joint spaces are well-maintained.  Minimal osteophyte formation in the lateral compartment and the patellofemoral joint.  No other bony abnormalities films are consistent with very mild osteoarthritis.  Neutral alignment    PMFS History: Patient Active Problem List   Diagnosis Date Noted   Bilateral primary osteoarthritis of knee 08/28/2022   Low back pain 12/05/2020   Pain in left leg 11/07/2020   History reviewed. No pertinent past medical history.  History reviewed. No pertinent family history.  History reviewed. No pertinent surgical history. Social History   Occupational History   Not on file  Tobacco Use   Smoking status: Former    Types: Cigarettes    Quit date: 01/11/2009    Years since quitting: 13.6   Smokeless tobacco: Never  Substance and Sexual Activity   Alcohol use: Not on file   Drug use: Not on file   Sexual activity: Not on file

## 2022-09-11 ENCOUNTER — Encounter: Payer: Self-pay | Admitting: Orthopaedic Surgery

## 2022-09-11 ENCOUNTER — Ambulatory Visit (INDEPENDENT_AMBULATORY_CARE_PROVIDER_SITE_OTHER): Payer: Medicare Other | Admitting: Orthopaedic Surgery

## 2022-09-11 DIAGNOSIS — M25561 Pain in right knee: Secondary | ICD-10-CM

## 2022-09-11 DIAGNOSIS — G8929 Other chronic pain: Secondary | ICD-10-CM | POA: Diagnosis not present

## 2022-09-11 DIAGNOSIS — M170 Bilateral primary osteoarthritis of knee: Secondary | ICD-10-CM

## 2022-09-11 NOTE — Progress Notes (Signed)
Office Visit Note   Patient: Kathryn Hensley           Date of Birth: 11-Nov-1955           MRN: [id number] Visit Date: 09/11/2022              Requested by: No referring provider defined for this encounter. PCP: Pcp, No   Assessment & Plan: Visit Diagnoses:  1. Chronic pain of right knee   2. Bilateral primary osteoarthritis of knee     Plan: Kathryn Hensley continues to have pain about her right knee despite the cortisone injection several weeks ago.  The pain is localized on the lateral and posterior aspect of her knee and seems to be worse when she gets up from a sitting position.  She has not had any groin pain.  She denies any significant low back pain or even radicular symptoms.  There is no thigh or calf pain.  There is no numbness or tingling related to her foot.  Her knee exam was relatively benign.  I am going to order an MRI scan of her knee and have her return shortly thereafter.  I do not think this is referred pain but do think there is some issue related to her leg.  It could be that she does have some hamstring issues and therapy may be of help but I would like to get the MRI scan first  Follow-Up Instructions: Return after MRI right knee.   Orders:  Orders Placed This Encounter  Procedures   MR Knee Right w/o contrast   No orders of the defined types were placed in this encounter.     Procedures: No procedures performed   Clinical Data: No additional findings.   Subjective: Chief Complaint  Patient presents with   Right Knee - Follow-up   Left Knee - Follow-up  Patient presents today for a two week follow up on her knees. She received a cortisone injection in her right knee on 08/28/2022. She said that the injection helped, but still having pain posteriorly.   HPI  Review of Systems   Objective: Vital Signs: There were no vitals taken for this visit.  Physical Exam Constitutional:      Appearance: She is well-developed.  Eyes:     Pupils: Pupils  are equal, round, and reactive to light.  Pulmonary:     Effort: Pulmonary effort is normal.  Skin:    General: Skin is warm and dry.  Neurological:     Mental Status: She is alert and oriented to person, place, and time.  Psychiatric:        Behavior: Behavior normal.     Ortho Exam awake alert and oriented x3.  Comfortable sitting and in no acute distress.  No use of ambulatory aid.  Straight leg raise was positive for little hamstring tightness posterior to the right knee.  There is no pain with range of motion of her right hip with internal/external rotation.  No knee effusion.  The right knee was not hot red warm or swollen.  No instability.  Full extension of flexed least 105 degrees.  Minimal medial joint pain.  Some patella crepitation but no pain with compression.  No popliteal mass.  No calf pain.  Good pulses distally.  Feet were warm.  Motor exam intact  Specialty Comments:  No specialty comments available.  Imaging: No results found.   PMFS History: Patient Active Problem List   Diagnosis Date Noted  Bilateral primary osteoarthritis of knee 08/28/2022   Low back pain 12/05/2020   Pain in left leg 11/07/2020   History reviewed. No pertinent past medical history.  History reviewed. No pertinent family history.  History reviewed. No pertinent surgical history. Social History   Occupational History   Not on file  Tobacco Use   Smoking status: Former    Types: Cigarettes    Quit date: 01/11/2009    Years since quitting: 13.6   Smokeless tobacco: Never  Substance and Sexual Activity   Alcohol use: Not on file   Drug use: Not on file   Sexual activity: Not on file

## 2022-09-25 ENCOUNTER — Ambulatory Visit
Admission: RE | Admit: 2022-09-25 | Discharge: 2022-09-25 | Disposition: A | Discharge status: Home / Self Care | Payer: Medicare Other | Source: Ambulatory Visit | Attending: Orthopaedic Surgery | Admitting: Orthopaedic Surgery

## 2022-09-25 DIAGNOSIS — G8929 Other chronic pain: Secondary | ICD-10-CM

## 2022-10-09 ENCOUNTER — Ambulatory Visit (INDEPENDENT_AMBULATORY_CARE_PROVIDER_SITE_OTHER): Payer: Medicare Other | Admitting: Orthopaedic Surgery

## 2022-10-09 ENCOUNTER — Encounter: Payer: Self-pay | Admitting: Orthopaedic Surgery

## 2022-10-09 DIAGNOSIS — M170 Bilateral primary osteoarthritis of knee: Secondary | ICD-10-CM | POA: Diagnosis not present

## 2022-10-09 NOTE — Progress Notes (Signed)
Office Visit Note   Patient: Kathryn Hensley           Date of Birth: September 30, 1956           MRN: [id number] Visit Date: 10/09/2022              Requested by: No referring provider defined for this encounter. PCP: Pcp, No   Assessment & Plan: Visit Diagnoses:  1. Bilateral primary osteoarthritis of knee     Plan: Kathryn Hensley had an MRI scan of her right knee without contrast.  Both medial and lateral menisci were intact.  There was some intrasubstance degeneration of the medial meniscus but without a discrete tear.  Ligaments were intact.  There was focal partial-thickness chondral fissure of the lateral patella facet near the patella apex but no trochlear cartilage defect.  Mild chondral thinning of the weightbearing medial compartment with small partial-thickness fissuring of the medial femoral condyle and partial thickness chondral surface irregularity within the lateral compartment.  Essentially the MRI scan was consistent with mild tricompartmental degenerative arthritis.  Long discussion regarding treatment options over time including over-the-counter medicines, Voltaren gel, exercise, pullover sleeve.  He could always repeat cortisone or even consider viscosupplementation.  For the moment she is going to continue with over-the-counter medicines and exercises.  Would have her see Dr. Magnus Ivan if she continues to have a problem in the future.  All questions were answered and she is comfortable with all of the above for the present time  Follow-Up Instructions: Return if symptoms worsen or fail to improve.   Orders:  No orders of the defined types were placed in this encounter.  No orders of the defined types were placed in this encounter.     Procedures: No procedures performed   Clinical Data: No additional findings.   Subjective: Chief Complaint  Patient presents with   Right Knee - Pain, Follow-up  Kathryn Hensley returns for evaluation of her right knee pain having had an MRI  scan.  She still has some discomfort as previously outlined.  No swelling.  No sensation of her knee giving way  HPI  Review of Systems   Objective: Vital Signs: There were no vitals taken for this visit.  Physical Exam Constitutional:      Appearance: She is well-developed.  Eyes:     Pupils: Pupils are equal, round, and reactive to light.  Pulmonary:     Effort: Pulmonary effort is normal.  Skin:    General: Skin is warm and dry.  Neurological:     Mental Status: She is alert and oriented to person, place, and time.  Psychiatric:        Behavior: Behavior normal.     Ortho Exam right knee was not hot red warm or swollen.  There was a little bit of lateral patella pain but no crepitation.  No effusion.  Very minimal medial joint pain.  No instability.  Full extension and flex well over 100 degrees.  No popliteal pain or mass.  Walks without a limp  Specialty Comments:  No specialty comments available.  Imaging: No results found.   PMFS History: Patient Active Problem List   Diagnosis Date Noted   Bilateral primary osteoarthritis of knee 08/28/2022   Low back pain 12/05/2020   Pain in left leg 11/07/2020   History reviewed. No pertinent past medical history.  History reviewed. No pertinent family history.  History reviewed. No pertinent surgical history. Social History   Occupational History  Not on file  Tobacco Use   Smoking status: Former    Types: Cigarettes    Quit date: 01/11/2009    Years since quitting: 13.7   Smokeless tobacco: Never  Substance and Sexual Activity   Alcohol use: Not on file   Drug use: Not on file   Sexual activity: Not on file     Kathryn Batman, MD   Note - This record has been created using [employer].  Chart creation errors have been sought, but may not always  have been located. Such creation errors do not reflect on  the standard of medical care.

## 2022-12-06 ENCOUNTER — Encounter: Payer: Self-pay | Admitting: Physician Assistant

## 2022-12-06 ENCOUNTER — Ambulatory Visit (INDEPENDENT_AMBULATORY_CARE_PROVIDER_SITE_OTHER): Payer: Medicare Other | Admitting: Physician Assistant

## 2022-12-06 ENCOUNTER — Ambulatory Visit (INDEPENDENT_AMBULATORY_CARE_PROVIDER_SITE_OTHER): Payer: Medicare Other

## 2022-12-06 ENCOUNTER — Ambulatory Visit (HOSPITAL_COMMUNITY)
Admission: RE | Admit: 2022-12-06 | Discharge: 2022-12-06 | Disposition: A | Discharge status: Home / Self Care | Payer: Medicare Other | Source: Ambulatory Visit | Attending: Physician Assistant | Admitting: Physician Assistant

## 2022-12-06 ENCOUNTER — Telehealth: Payer: Self-pay | Admitting: Radiology

## 2022-12-06 DIAGNOSIS — M25551 Pain in right hip: Secondary | ICD-10-CM

## 2022-12-06 DIAGNOSIS — M79661 Pain in right lower leg: Secondary | ICD-10-CM | POA: Diagnosis not present

## 2022-12-06 DIAGNOSIS — M5416 Radiculopathy, lumbar region: Secondary | ICD-10-CM

## 2022-12-06 MED ORDER — MELOXICAM 7.5 MG PO TABS: 7.5000 mg | ORAL_TABLET | Freq: Every day | ORAL | 0 refills | Status: AC

## 2022-12-06 NOTE — Telephone Encounter (Signed)
[facility] called and states that the patient was NEGATIVE for DVT.

## 2022-12-06 NOTE — Addendum Note (Signed)
Addended by: Robyne Peers on: 12/06/2022 03:48 PM   Modules accepted: Orders

## 2022-12-06 NOTE — Progress Notes (Signed)
Right lower extremity venous duplex has been completed. Preliminary results can be found in CV Proc through chart review.  Results were given to [place] at [place] office.  12/06/22 3:35 PM Carlos Levering RVT

## 2022-12-06 NOTE — Progress Notes (Signed)
Office Visit Note   Patient: Kathryn Hensley           Date of Birth: 04-17-1956           MRN: [id number] Visit Date: 12/06/2022              Requested by: No referring provider defined for this encounter. PCP: Pcp, No  Chief Complaint  Patient presents with   Lower Back - Pain   Right Hip - Pain      HPI: Ms. Kathryn Hensley is a pleasant 67 year old woman who is a former patient of Dr. Rudene Anda.  She has had a 69-monthhistory of right posterior pain in her hip.  This progressed down to her knee.  She denies any numbness tingling or radicular symptoms.  She has seen Dr. WGerhard Perchesin November and December about it.  She had an MRI of her knee which showed some arthritis but did not explain her symptoms.  She was given some exercises to do.  She continues to have problems with her right leg.  She is now getting more tightness in her cath and it is painful.  She still has pain over the posterior right inferior buttock.  She is having more trouble extending her leg at the knee because of the pain denies any fever or chills.  She has tried heat ice and ibuprofen  Assessment & Plan: Visit Diagnoses:  1. Right calf pain   2. Lumbar radiculopathy   3. Pain of right hip     Plan: Examination she does have a history of back problems but this seems atypical.  She is tender over the proximal insertion of the hamstring.  We talked extensively about what made this first happened and only thing she can relate to was sitting on a hard bench for quite a while waiting for a table at a restaurant.  This all could be related to her hamstring however she has had increasing calf pain and has a positive Homans' sign and tenderness to palpation over the calf today.  I explained to her I think it is important to rule this out first and she is in agreement.  Will also give her some meloxicam as this is helped with various knee problems in the past.  If a blood clot is ruled out could discuss with Dr. BRolena Infantea  ultrasound evaluation into the proximal hamstring or possibly some physical therapy  Follow-Up Instructions:   Ortho Exam  Patient is alert, oriented, no adenopathy, well-dressed, normal affect, normal respiratory effort. Patient has an antalgic gait because it is painful for her to fully extend her leg.  She does not like the place pressure on the right buttock over the ischial tuberosity.  Where she is quite tender.  Extending her leg to full extension increases this.  She does not have any radicular findings her strength is intact her sensation is intact she has good ability to plantarflex and dorsiflex her ankle and extend and flex her leg.  She does have tightness feeling in the cath and she does have pain to palpation in the calf.  She does have pain with passive stretch  Imaging: XR HIP UNILAT W OR W/O PELVIS 2-3 VIEWS RIGHT  Result Date: 12/06/2022 AP pelvis right hip demonstrate overall no significant degenerative changes no evidence of fracture she has well-preserved and congruent joint spacing between the femoral head and the acetabulum  XR Lumbar Spine 2-3 Views  Result Date: 12/06/2022 Radiographs of her lumbar  spine show some endplate degeneration.  No acute fractures.  No listhesis.  No images are attached to the encounter.  Labs: No results found for: "HGBA1C", "ESRSEDRATE", "CRP", "LABURIC", "REPTSTATUS", "GRAMSTAIN", "CULT", "LABORGA"   No results found for: "ALBUMIN", "PREALBUMIN", "CBC"  No results found for: "MG" No results found for: "VD25OH"  No results found for: "PREALBUMIN"     No data to display           There is no height or weight on file to calculate BMI.  Orders:  Orders Placed This Encounter  Procedures   XR Lumbar Spine 2-3 Views   XR HIP UNILAT W OR W/O PELVIS 2-3 VIEWS RIGHT   VAS [place] LOWER EXTREMITY VENOUS (DVT)   Meds ordered this encounter  Medications   meloxicam (MOBIC) 7.5 MG tablet    Sig: Take 1 tablet (7.5 mg total) by mouth  daily.    Dispense:  30 tablet    Refill:  0     Procedures: No procedures performed  Clinical Data: No additional findings.  ROS:  All other systems negative, except as noted in the HPI. Review of Systems  Objective: Vital Signs: There were no vitals taken for this visit.  Specialty Comments:  No specialty comments available.  PMFS History: Patient Active Problem List   Diagnosis Date Noted   Right calf pain 12/06/2022   Bilateral primary osteoarthritis of knee 08/28/2022   Low back pain 12/05/2020   Pain in left leg 11/07/2020   History reviewed. No pertinent past medical history.  History reviewed. No pertinent family history.  History reviewed. No pertinent surgical history. Social History   Occupational History   Not on file  Tobacco Use   Smoking status: Former    Types: Cigarettes    Quit date: 01/11/2009    Years since quitting: 13.9   Smokeless tobacco: Never  Substance and Sexual Activity   Alcohol use: Not on file   Drug use: Not on file   Sexual activity: Not on file

## 2023-01-02 ENCOUNTER — Other Ambulatory Visit: Payer: Self-pay | Admitting: Physician Assistant

## 2024-02-04 DIAGNOSIS — Z1159 Encounter for screening for other viral diseases: Secondary | ICD-10-CM | POA: Diagnosis not present

## 2024-02-04 DIAGNOSIS — Z13220 Encounter for screening for lipoid disorders: Secondary | ICD-10-CM | POA: Diagnosis not present

## 2024-02-04 DIAGNOSIS — Z1331 Encounter for screening for depression: Secondary | ICD-10-CM | POA: Diagnosis not present

## 2024-02-04 DIAGNOSIS — Z0000 Encounter for general adult medical examination without abnormal findings: Secondary | ICD-10-CM | POA: Diagnosis not present

## 2024-02-04 DIAGNOSIS — Z23 Encounter for immunization: Secondary | ICD-10-CM | POA: Diagnosis not present

## 2024-02-04 DIAGNOSIS — M170 Bilateral primary osteoarthritis of knee: Secondary | ICD-10-CM | POA: Diagnosis not present

## 2024-02-04 DIAGNOSIS — Z131 Encounter for screening for diabetes mellitus: Secondary | ICD-10-CM | POA: Diagnosis not present

## 2024-02-04 DIAGNOSIS — R42 Dizziness and giddiness: Secondary | ICD-10-CM | POA: Diagnosis not present

## 2024-02-04 DIAGNOSIS — Z130 Encounter for screening for diseases of the blood and blood-forming organs and certain disorders involving the immune mechanism: Secondary | ICD-10-CM | POA: Diagnosis not present

## 2024-02-04 DIAGNOSIS — E2839 Other primary ovarian failure: Secondary | ICD-10-CM | POA: Diagnosis not present

## 2024-02-12 DIAGNOSIS — Z1231 Encounter for screening mammogram for malignant neoplasm of breast: Secondary | ICD-10-CM | POA: Diagnosis not present

## 2024-02-12 DIAGNOSIS — M810 Age-related osteoporosis without current pathological fracture: Secondary | ICD-10-CM | POA: Diagnosis not present

## 2024-04-27 DIAGNOSIS — L03213 Periorbital cellulitis: Secondary | ICD-10-CM | POA: Diagnosis not present

## 2024-04-27 DIAGNOSIS — L0201 Cutaneous abscess of face: Secondary | ICD-10-CM | POA: Diagnosis not present

## 2024-04-27 DIAGNOSIS — L03211 Cellulitis of face: Secondary | ICD-10-CM | POA: Diagnosis not present

## 2024-08-09 DIAGNOSIS — R7303 Prediabetes: Secondary | ICD-10-CM | POA: Diagnosis not present

## 2024-08-09 DIAGNOSIS — R609 Edema, unspecified: Secondary | ICD-10-CM | POA: Diagnosis not present

## 2024-08-09 DIAGNOSIS — E782 Mixed hyperlipidemia: Secondary | ICD-10-CM | POA: Diagnosis not present

## 2024-08-09 DIAGNOSIS — R634 Abnormal weight loss: Secondary | ICD-10-CM | POA: Diagnosis not present

## 2024-08-10 ENCOUNTER — Encounter (HOSPITAL_BASED_OUTPATIENT_CLINIC_OR_DEPARTMENT_OTHER): Payer: Self-pay | Admitting: Family Medicine

## 2024-08-10 DIAGNOSIS — E782 Mixed hyperlipidemia: Secondary | ICD-10-CM

## 2024-08-17 ENCOUNTER — Encounter (HOSPITAL_BASED_OUTPATIENT_CLINIC_OR_DEPARTMENT_OTHER): Payer: Self-pay | Admitting: Family Medicine

## 2024-08-17 DIAGNOSIS — E782 Mixed hyperlipidemia: Secondary | ICD-10-CM

## 2024-08-18 ENCOUNTER — Other Ambulatory Visit (HOSPITAL_BASED_OUTPATIENT_CLINIC_OR_DEPARTMENT_OTHER): Payer: Self-pay | Admitting: Family Medicine

## 2024-08-18 DIAGNOSIS — E782 Mixed hyperlipidemia: Secondary | ICD-10-CM

## 2024-08-23 ENCOUNTER — Ambulatory Visit
Admission: RE | Admit: 2024-08-23 | Discharge: 2024-08-23 | Disposition: A | Discharge status: Home / Self Care | Payer: Self-pay | Source: Ambulatory Visit | Attending: Family Medicine | Admitting: Family Medicine

## 2024-08-23 DIAGNOSIS — E782 Mixed hyperlipidemia: Secondary | ICD-10-CM | POA: Insufficient documentation
